# Patient Record
Sex: Female | Born: 1980 | Race: Black or African American | Hispanic: No | Marital: Single | Smoking: Never smoker
Health system: Southern US, Community
[De-identification: ages and names within clinical notes are randomized; demographics above are authoritative.]

## PROBLEM LIST (undated history)

## (undated) ENCOUNTER — Inpatient Hospital Stay (HOSPITAL_COMMUNITY): Payer: Self-pay

## (undated) DIAGNOSIS — D219 Benign neoplasm of connective and other soft tissue, unspecified: Secondary | ICD-10-CM

## (undated) DIAGNOSIS — F329 Major depressive disorder, single episode, unspecified: Secondary | ICD-10-CM

## (undated) DIAGNOSIS — F32A Depression, unspecified: Secondary | ICD-10-CM

## (undated) HISTORY — PX: NO PAST SURGERIES: SHX2092

---

## 1999-07-27 ENCOUNTER — Other Ambulatory Visit: Admission: RE | Admit: 1999-07-27 | Discharge: 1999-07-27 | Payer: Self-pay | Admitting: Obstetrics and Gynecology

## 2000-09-06 ENCOUNTER — Emergency Department (HOSPITAL_COMMUNITY): Admission: EM | Admit: 2000-09-06 | Discharge: 2000-09-06 | Payer: Self-pay | Admitting: Emergency Medicine

## 2000-09-06 ENCOUNTER — Encounter: Payer: Self-pay | Admitting: Emergency Medicine

## 2001-01-17 ENCOUNTER — Emergency Department (HOSPITAL_COMMUNITY): Admission: EM | Admit: 2001-01-17 | Discharge: 2001-01-17 | Payer: Self-pay | Admitting: Emergency Medicine

## 2001-08-15 ENCOUNTER — Encounter: Admission: RE | Admit: 2001-08-15 | Discharge: 2001-08-15 | Payer: Self-pay | Admitting: Obstetrics and Gynecology

## 2001-08-15 ENCOUNTER — Encounter: Payer: Self-pay | Admitting: Obstetrics and Gynecology

## 2001-11-22 ENCOUNTER — Other Ambulatory Visit: Admission: RE | Admit: 2001-11-22 | Discharge: 2001-11-22 | Payer: Self-pay | Admitting: Obstetrics and Gynecology

## 2001-12-10 ENCOUNTER — Encounter: Payer: Self-pay | Admitting: Obstetrics and Gynecology

## 2001-12-10 ENCOUNTER — Ambulatory Visit (HOSPITAL_COMMUNITY): Admission: RE | Admit: 2001-12-10 | Discharge: 2001-12-10 | Payer: Self-pay | Admitting: Obstetrics and Gynecology

## 2002-01-29 ENCOUNTER — Inpatient Hospital Stay (HOSPITAL_COMMUNITY): Admission: AD | Admit: 2002-01-29 | Discharge: 2002-01-29 | Payer: Self-pay | Admitting: Obstetrics and Gynecology

## 2002-01-30 ENCOUNTER — Inpatient Hospital Stay (HOSPITAL_COMMUNITY): Admission: AD | Admit: 2002-01-30 | Discharge: 2002-02-01 | Payer: Self-pay | Admitting: Obstetrics and Gynecology

## 2002-03-13 ENCOUNTER — Other Ambulatory Visit: Admission: RE | Admit: 2002-03-13 | Discharge: 2002-03-13 | Payer: Self-pay | Admitting: Obstetrics and Gynecology

## 2005-11-23 ENCOUNTER — Encounter: Payer: Self-pay | Admitting: Obstetrics and Gynecology

## 2005-11-23 ENCOUNTER — Ambulatory Visit: Payer: Self-pay | Admitting: Obstetrics and Gynecology

## 2005-11-28 ENCOUNTER — Ambulatory Visit: Payer: Self-pay | Admitting: *Deleted

## 2006-12-21 ENCOUNTER — Ambulatory Visit: Payer: Self-pay | Admitting: Obstetrics and Gynecology

## 2006-12-21 ENCOUNTER — Encounter: Payer: Self-pay | Admitting: Obstetrics and Gynecology

## 2007-09-19 ENCOUNTER — Emergency Department (HOSPITAL_COMMUNITY): Admission: EM | Admit: 2007-09-19 | Discharge: 2007-09-19 | Payer: Self-pay | Admitting: Family Medicine

## 2007-10-10 ENCOUNTER — Emergency Department (HOSPITAL_COMMUNITY): Admission: EM | Admit: 2007-10-10 | Discharge: 2007-10-10 | Payer: Self-pay | Admitting: Family Medicine

## 2008-06-18 ENCOUNTER — Encounter: Admission: RE | Admit: 2008-06-18 | Discharge: 2008-06-18 | Payer: Self-pay | Admitting: Obstetrics

## 2008-06-19 ENCOUNTER — Ambulatory Visit (HOSPITAL_COMMUNITY): Admission: RE | Admit: 2008-06-19 | Discharge: 2008-06-19 | Payer: Self-pay | Admitting: Obstetrics

## 2010-12-24 NOTE — Group Therapy Note (Signed)
NAME:  Heidi Ryan, Heidi Ryan               ACCOUNT NO.:  [id number]   MEDICAL RECORD NO.:  [id number]          PATIENT TYPE:  WOC   LOCATION:  [facility]                   FACILITY:  [facility]   PHYSICIAN:  Argentina Donovan, MD        DATE OF BIRTH:  07/02/1981   DATE OF SERVICE:  11/23/2005                                    CLINIC NOTE   The patient is a 30 year old gravida 1, para 1-0-0-1 with child age 40 years  old who has come in for her annual pelvic examination, Pap smear, and would  like to be started on oral contraceptives.  She works in a day care center,  so needs a PPD which will be given.   PHYSICAL EXAMINATION:  ABDOMEN:  Soft, flat, nontender.  No masses or  organomegaly.  PELVIC:  External genitalia is normal.  BUS within normal limits.  Vagina is  clean and well rugated.  Cervix is clean and parous.  Uterus anterior,  normal size, shape, consistency with normal adnexa.  Cul-de-sac was free.   Pap smear was taken.  PPD given and the patient was prescribed YAZ and given  two sample packets.  We also discussed the method for taking the birth  control.   IMPRESSION:  Normal GYN annual examination.           ______________________________  Argentina Donovan, MD     PR/MEDQ  D:  11/23/2005  T:  11/24/2005  Job:  [id number]

## 2010-12-24 NOTE — H&P (Signed)
[facility]  Patient:    Heidi Ryan, BROPHY Visit Number: [id number] MRN: [id number]          Service Type: OBS Location: [facility] Attending Physician:  Jaymes Graff A Dictated by:   Mack Guise, C.N.M. Admit Date:  01/29/2002 Discharge Date: 01/29/2002                           History and Physical  HISTORY OF PRESENT ILLNESS:   The patient is a 30 year old single African-American female gravida 3 para 0-0-2-0; [facility] February 08, 2002 by ultrasound on August 15, 2001.  She presents from the office of [facility] in active labor, being 6 cm dilated.  She reports positive fetal movement, no bleeding, no rupture of membranes.  Denies any headache, visual changes, or epigastric pain.  Her pregnancy has been followed by the M.D. service at [facility] and is remarkable for: 1. Questionable LMP, conceived on OCPs. 2. Late to care.  PRENATAL LABORATORY DATA:     On November 22, 2001:  Hemoglobin and hematocrit 10.2 and 31.4; platelets 214,000.  Blood type and Rh A positive, antibody screen negative.  Sickle cell trait negative.  VDRL nonreactive.  Rubella immune.  Hepatitis B surface antigen negative.  HIV nonreactive.  Pap smear within normal limits.  GC and chlamydia negative.  At 28 weeks, one-hour glucose challenge 79.  She is positive for group B strep.  HISTORY OF PREGNANCY:         This patient was initially evaluated at the office of [facility] on November 20, 2001 at 26 weeks 6 days gestation.  Her pregnancy has been followed for questionable IUGR, but follow-up ultrasound at 30 weeks found estimated fetal weight 50-75th percentile with normal fluid and growth. The patients cervix at that time was noted to be 1 cm dilated, 50% effaced, and she was placed on level 2 bedrest.  Fetal fibronectin at that time was negative, and the patients pregnancy has progressed normally from that time. She has been normotensive with no proteinuria.  ALLERGIES:                     PENICILLIN.  HABITS:                       The patient denies the use of tobacco, alcohol, or illicit drugs.  OBSTETRICAL HISTORY:          In 2002, induced AB; in 2002, SAB; and the present pregnancy.  MEDICAL HISTORY:              Unremarkable.  FAMILY HISTORY:               Maternal uncle - MI.  Paternal grandmother and maternal grandmother with a history of chronic hypertension.  Paternal grandfather - diabetes.  Maternal grandmother - leukemia.  Maternal uncle - stroke.  The patients brother was shot at age 51 and passed away.  The patients ex-boyfriend committed suicide at age 18.  GENETIC HISTORY:              Unremarkable.  There is no family history of familial or genetic disorders, children that died in infancy or that were born with birth defects.  SOCIAL HISTORY:               The patient is a single 30 year old African-American female.  The father of the baby, Heidi Ryan, is involved and supportive.  They  are [employer] in their faith.  REVIEW OF SYSTEMS:            There are no signs or symptoms suggestive of focal or systemic disease and the patient is typical of one with a uterine pregnancy at term in active labor.  PHYSICAL EXAMINATION:  VITAL SIGNS:                  Stable, afebrile.  HEENT:                        Unremarkable.  HEART:                        Regular rate and rhythm.  LUNGS:                        Clear.  ABDOMEN:                      Soft and nontender, gravida in its contour. Uterine fundus is noted to extend 38 cm above the level of the pubic symphysis.  Leopolds maneuvers find the infant to be in a longitudinal lie, cephalic presentation, and the estimated fetal weight 6.5-7 pounds.  PELVIC:                       Digital exam of the cervix finds it to be 6 cm dilated with bulging membranes, the vertex at a 0 station in the ROP position. Artificial rupture of membranes was performed by Dr. Dois Davenport Rivard for clear fluid.  EXTREMITIES:                   Show no pathologic edema.  DTRs are 1+ with no clonus.  ASSESSMENT:                   1. Intrauterine pregnancy at term.                               2. Active labor.  PLAN:                         1. Admit per Dr. Dois Davenport Rivard.                               2. Follow expectantly in anticipation of                                  spontaneous vaginal delivery. Dictated by:   Mack Guise, C.N.M. Attending Physician:  Michael Litter DD:  01/30/02 TD:  01/31/02 Job: [id number]

## 2011-05-02 LAB — INFLUENZA A AND B ANTIGEN (CONVERTED LAB)
Inflenza A Ag: NEGATIVE
Influenza B Ag: NEGATIVE

## 2011-11-27 ENCOUNTER — Encounter (HOSPITAL_COMMUNITY): Payer: Self-pay

## 2011-11-27 ENCOUNTER — Emergency Department (HOSPITAL_COMMUNITY)
Admission: EM | Admit: 2011-11-27 | Discharge: 2011-11-27 | Disposition: A | Discharge status: Home / Self Care | Payer: Self-pay | Attending: Emergency Medicine | Admitting: Emergency Medicine

## 2011-11-27 DIAGNOSIS — R350 Frequency of micturition: Secondary | ICD-10-CM | POA: Insufficient documentation

## 2011-11-27 DIAGNOSIS — N390 Urinary tract infection, site not specified: Secondary | ICD-10-CM | POA: Insufficient documentation

## 2011-11-27 LAB — URINE MICROSCOPIC-ADD ON

## 2011-11-27 LAB — URINALYSIS, ROUTINE W REFLEX MICROSCOPIC
Bilirubin Urine: NEGATIVE
Nitrite: NEGATIVE
Protein, ur: NEGATIVE mg/dL
Urobilinogen, UA: 2 mg/dL — ABNORMAL HIGH (ref 0.0–1.0)
pH: 7 (ref 5.0–8.0)

## 2011-11-27 MED ORDER — SULFAMETHOXAZOLE-TRIMETHOPRIM 800-160 MG PO TABS: 1.0000 | ORAL_TABLET | Freq: Two times a day (BID) | ORAL | Status: AC

## 2011-11-27 NOTE — ED Provider Notes (Signed)
History     CSN: [id number]  Arrival date & time 11/27/11  1114   First MD Initiated Contact with Patient 11/27/11 1138      Chief Complaint  Patient presents with  . Urinary Frequency    (Consider location/radiation/quality/duration/timing/severity/associated sxs/prior treatment) The history is provided by the patient.   patient is a generally healthy 31 year old female who presents to the emergency department with a chief complaint of 2 weeks of urinary frequency. Denies dysuria. Denies fever, chills, abdominal pain, hematuria, vaginal discharge/itching/irritation, polydipsia. LMP 2 weeks ago. Nothing makes her symptoms better or worse. No prior treatment.  History reviewed. No pertinent past medical history.  History reviewed. No pertinent past surgical history.  No family history on file.  History  Substance Use Topics  . Smoking status: Never Smoker   . Smokeless tobacco: Not on file  . Alcohol Use: No     Review of Systems  Constitutional: Negative for fever and chills.  HENT: Negative for sore throat and neck pain.   Eyes: Negative for pain and visual disturbance.  Respiratory: Negative for cough and shortness of breath.   Cardiovascular: Negative for chest pain.  Gastrointestinal: Negative for nausea, vomiting and abdominal pain.  Genitourinary:       See HPI, otherwise negative  Musculoskeletal: Negative for back pain and gait problem.  Skin: Negative for rash.  Neurological: Negative for dizziness, syncope, weakness and headaches.  Psychiatric/Behavioral: Negative for confusion.    Allergies  Review of patient's allergies indicates no known allergies.  Home Medications  No current outpatient prescriptions on file.  BP 102/56  Pulse 70  Temp(Src) 98.8 F (37.1 C) (Oral)  Resp 16  SpO2 100%  LMP 11/12/2011  Physical Exam  Constitutional:       Vital signs reviewed and are normal. Afebrile.   patient is well-developed, well-nourished, in no acute  distress. Head is normocephalic and atraumatic. [place] are moist. Neck is supple. Heart with regular rate and rhythm. No murmur. Lungs are clear to auscultation bilaterally. Normal respiratory effort. Abdomen is soft, nontender nondistended. Bowel sounds are normal. There is no rebound or guarding. Extremities without edema or tenderness. Patient is alert and oriented x3. Speech is clear. Normal gait. Skin is warm and dry without any obvious rash or lesion.  ED Course  Procedures (including critical care time)  Labs Reviewed  URINALYSIS, ROUTINE W REFLEX MICROSCOPIC - Abnormal; Notable for the following:    APPearance CLOUDY (*)    Urobilinogen, UA 2.0 (*)    Leukocytes, UA SMALL (*)    All other components within normal limits  URINE MICROSCOPIC-ADD ON - Abnormal; Notable for the following:    Squamous Epithelial / LPF FEW (*)    Bacteria, UA MANY (*)    All other components within normal limits  PREGNANCY, URINE   No results found.   Dx 1: UTI   MDM  Urinary frequency. Benign physical examination, doubt pyelonephritis. Negative pregnancy test. Many bacteria in urine, will tx for UTI. Advised PCP follow-up if no improvement.        Shaaron Adler, PA-C 11/27/11 1300

## 2011-11-27 NOTE — ED Provider Notes (Signed)
Medical screening examination/treatment/procedure(s) were performed by non-physician practitioner and as supervising physician I was immediately available for consultation/collaboration.   Forbes Cellar, MD 11/27/11 1301

## 2011-11-27 NOTE — ED Notes (Signed)
Pt states urinary frequency x2 weeks denies odor pain or burning

## 2011-11-27 NOTE — Discharge Instructions (Signed)

## 2011-11-27 NOTE — ED Notes (Signed)
Pt alert and oriented x4. Respirations even and unlabored, bilateral symmetrical rise and fall of chest. Skin warm and dry. In no acute distress. Denies needs.  PA at bedside 

## 2011-11-30 LAB — URINE CULTURE
Culture  Setup Time: 201304211803
Special Requests: NORMAL

## 2013-07-21 ENCOUNTER — Emergency Department (HOSPITAL_COMMUNITY)
Admission: EM | Admit: 2013-07-21 | Discharge: 2013-07-21 | Disposition: A | Discharge status: Home / Self Care | Payer: PRIVATE HEALTH INSURANCE | Attending: Emergency Medicine | Admitting: Emergency Medicine

## 2013-07-21 ENCOUNTER — Encounter (HOSPITAL_COMMUNITY): Payer: Self-pay | Admitting: Emergency Medicine

## 2013-07-21 DIAGNOSIS — IMO0002 Reserved for concepts with insufficient information to code with codable children: Secondary | ICD-10-CM | POA: Insufficient documentation

## 2013-07-21 DIAGNOSIS — Z79899 Other long term (current) drug therapy: Secondary | ICD-10-CM | POA: Insufficient documentation

## 2013-07-21 DIAGNOSIS — S4980XA Other specified injuries of shoulder and upper arm, unspecified arm, initial encounter: Secondary | ICD-10-CM | POA: Insufficient documentation

## 2013-07-21 DIAGNOSIS — S199XXA Unspecified injury of neck, initial encounter: Secondary | ICD-10-CM

## 2013-07-21 DIAGNOSIS — S0993XA Unspecified injury of face, initial encounter: Secondary | ICD-10-CM | POA: Insufficient documentation

## 2013-07-21 DIAGNOSIS — S46909A Unspecified injury of unspecified muscle, fascia and tendon at shoulder and upper arm level, unspecified arm, initial encounter: Secondary | ICD-10-CM | POA: Insufficient documentation

## 2013-07-21 MED ORDER — IBUPROFEN 800 MG PO TABS: 800.0000 mg | ORAL_TABLET | Freq: Three times a day (TID) | ORAL | Status: DC

## 2013-07-21 MED ORDER — METHOCARBAMOL 500 MG PO TABS: 500.0000 mg | ORAL_TABLET | Freq: Two times a day (BID) | ORAL | Status: DC
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
## 2013-07-21 NOTE — ED Provider Notes (Signed)
Medical screening examination/treatment/procedure(s) were performed by non-physician practitioner and as supervising physician I was immediately available for consultation/collaboration.  EKG Interpretation   None        Flint Melter, MD 07/21/13 [phone]
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
## 2013-07-24 ENCOUNTER — Emergency Department (HOSPITAL_COMMUNITY)
Admission: EM | Admit: 2013-07-24 | Discharge: 2013-07-24 | Disposition: A | Discharge status: Home / Self Care | Payer: PRIVATE HEALTH INSURANCE | Attending: Emergency Medicine | Admitting: Emergency Medicine

## 2013-07-24 ENCOUNTER — Encounter (HOSPITAL_COMMUNITY): Payer: Self-pay | Admitting: Emergency Medicine

## 2013-07-24 ENCOUNTER — Emergency Department (HOSPITAL_COMMUNITY): Payer: PRIVATE HEALTH INSURANCE

## 2013-07-24 DIAGNOSIS — S4980XA Other specified injuries of shoulder and upper arm, unspecified arm, initial encounter: Secondary | ICD-10-CM | POA: Insufficient documentation

## 2013-07-24 DIAGNOSIS — IMO0002 Reserved for concepts with insufficient information to code with codable children: Secondary | ICD-10-CM | POA: Insufficient documentation

## 2013-07-24 DIAGNOSIS — T148XXA Other injury of unspecified body region, initial encounter: Secondary | ICD-10-CM

## 2013-07-24 DIAGNOSIS — S46909A Unspecified injury of unspecified muscle, fascia and tendon at shoulder and upper arm level, unspecified arm, initial encounter: Secondary | ICD-10-CM | POA: Insufficient documentation

## 2013-07-24 DIAGNOSIS — Z79899 Other long term (current) drug therapy: Secondary | ICD-10-CM | POA: Insufficient documentation

## 2013-07-24 DIAGNOSIS — T7411XA Adult physical abuse, confirmed, initial encounter: Secondary | ICD-10-CM | POA: Insufficient documentation

## 2013-07-24 DIAGNOSIS — M62838 Other muscle spasm: Secondary | ICD-10-CM

## 2013-07-24 DIAGNOSIS — S0993XA Unspecified injury of face, initial encounter: Secondary | ICD-10-CM | POA: Insufficient documentation

## 2013-07-24 MED ORDER — KETOROLAC TROMETHAMINE 60 MG/2ML IM SOLN
60.0000 mg | Freq: Once | INTRAMUSCULAR | Status: AC
  Administered 2013-07-24: 60 mg via INTRAMUSCULAR
  Filled 2013-07-24: qty 2
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
## 2013-07-26 NOTE — ED Provider Notes (Signed)
Medical screening examination/treatment/procedure(s) were performed by non-physician practitioner and as supervising physician I was immediately available for consultation/collaboration.  EKG Interpretation   None       Devoria Albe, MD, Armando Gang   Ward Givens, MD 07/26/13 1501

## 2013-12-11 ENCOUNTER — Other Ambulatory Visit: Payer: Self-pay | Admitting: General Practice

## 2013-12-11 DIAGNOSIS — R1030 Lower abdominal pain, unspecified: Secondary | ICD-10-CM

## 2014-01-07 ENCOUNTER — Ambulatory Visit
Admission: RE | Admit: 2014-01-07 | Discharge: 2014-01-07 | Disposition: A | Discharge status: Home / Self Care | Payer: PRIVATE HEALTH INSURANCE | Source: Ambulatory Visit | Attending: General Practice | Admitting: General Practice

## 2014-01-07 ENCOUNTER — Encounter (INDEPENDENT_AMBULATORY_CARE_PROVIDER_SITE_OTHER): Payer: Self-pay

## 2014-01-07 DIAGNOSIS — R1030 Lower abdominal pain, unspecified: Secondary | ICD-10-CM

## 2014-01-17 ENCOUNTER — Ambulatory Visit
Admission: RE | Admit: 2014-01-17 | Discharge: 2014-01-17 | Disposition: A | Discharge status: Home / Self Care | Payer: PRIVATE HEALTH INSURANCE | Source: Ambulatory Visit | Attending: General Practice | Admitting: General Practice

## 2014-02-18 ENCOUNTER — Inpatient Hospital Stay (HOSPITAL_COMMUNITY): Payer: No Typology Code available for payment source

## 2014-02-18 ENCOUNTER — Inpatient Hospital Stay (HOSPITAL_COMMUNITY)
Admission: AD | Admit: 2014-02-18 | Discharge: 2014-02-18 | Disposition: A | Discharge status: Home / Self Care | Payer: No Typology Code available for payment source | Source: Ambulatory Visit | Attending: Obstetrics and Gynecology | Admitting: Obstetrics and Gynecology

## 2014-02-18 ENCOUNTER — Encounter (HOSPITAL_COMMUNITY): Payer: Self-pay

## 2014-02-18 DIAGNOSIS — R109 Unspecified abdominal pain: Secondary | ICD-10-CM | POA: Insufficient documentation

## 2014-02-18 DIAGNOSIS — O4590 Premature separation of placenta, unspecified, unspecified trimester: Secondary | ICD-10-CM

## 2014-02-18 DIAGNOSIS — O418X10 Other specified disorders of amniotic fluid and membranes, first trimester, not applicable or unspecified: Secondary | ICD-10-CM

## 2014-02-18 DIAGNOSIS — O208 Other hemorrhage in early pregnancy: Secondary | ICD-10-CM | POA: Insufficient documentation

## 2014-02-18 DIAGNOSIS — O468X1 Other antepartum hemorrhage, first trimester: Secondary | ICD-10-CM

## 2014-02-18 DIAGNOSIS — D259 Leiomyoma of uterus, unspecified: Secondary | ICD-10-CM

## 2014-02-18 HISTORY — DX: Benign neoplasm of connective and other soft tissue, unspecified: D21.9

## 2014-02-18 LAB — URINE MICROSCOPIC-ADD ON: WBC, UA: NONE SEEN WBC/hpf (ref <3)

## 2014-02-18 LAB — CBC WITH DIFFERENTIAL/PLATELET
Basophils Absolute: 0 10*3/uL (ref 0.0–0.1)
Basophils Relative: 0 % (ref 0–1)
EOS ABS: 0.1 10*3/uL (ref 0.0–0.7)
Eosinophils Relative: 1 % (ref 0–5)
HCT: 34.3 % — ABNORMAL LOW (ref 36.0–46.0)
HEMOGLOBIN: 11.6 g/dL — AB (ref 12.0–15.0)
Lymphocytes Relative: 26 % (ref 12–46)
Lymphs Abs: 3.3 10*3/uL (ref 0.7–4.0)
MCH: 30.4 pg (ref 26.0–34.0)
MCHC: 33.8 g/dL (ref 30.0–36.0)
MCV: 90 fL (ref 78.0–100.0)
MONOS PCT: 7 % (ref 3–12)
Monocytes Absolute: 0.9 10*3/uL (ref 0.1–1.0)
NEUTROS PCT: 66 % (ref 43–77)
Neutro Abs: 8.3 10*3/uL — ABNORMAL HIGH (ref 1.7–7.7)
PLATELETS: 251 10*3/uL (ref 150–400)
RBC: 3.81 MIL/uL — AB (ref 3.87–5.11)
RDW: 13.1 % (ref 11.5–15.5)
WBC: 12.6 10*3/uL — AB (ref 4.0–10.5)

## 2014-02-18 LAB — URINALYSIS, ROUTINE W REFLEX MICROSCOPIC
BILIRUBIN URINE: NEGATIVE
GLUCOSE, UA: NEGATIVE mg/dL
Ketones, ur: NEGATIVE mg/dL
Leukocytes, UA: NEGATIVE
NITRITE: NEGATIVE
PROTEIN: NEGATIVE mg/dL
SPECIFIC GRAVITY, URINE: 1.01 (ref 1.005–1.030)
UROBILINOGEN UA: 0.2 mg/dL (ref 0.0–1.0)
pH: 7 (ref 5.0–8.0)

## 2014-02-18 LAB — ABO/RH: ABO/RH(D): A POS

## 2014-02-18 LAB — WET PREP, GENITAL
Trich, Wet Prep: NONE SEEN
YEAST WET PREP: NONE SEEN

## 2014-02-18 LAB — POCT PREGNANCY, URINE: Preg Test, Ur: POSITIVE — AB

## 2014-02-18 LAB — HCG, QUANTITATIVE, PREGNANCY: HCG, BETA CHAIN, QUANT, S: 189344 m[IU]/mL — AB (ref <5)

## 2014-02-18 NOTE — Discharge Instructions (Signed)
Fibroids Fibroids are lumps (tumors) that can occur any place in a woman's body. These lumps are not cancerous. Fibroids vary in size, weight, and where they grow. HOME CARE  Do not take aspirin.  Write down the number of pads or tampons you use during your period. Tell your doctor. This can help determine the best treatment for you. GET HELP RIGHT AWAY IF:  You have pain in your lower belly (abdomen) that is not helped with medicine.  You have cramps that are not helped with medicine.  You have more bleeding between or during your period.  You feel lightheaded or pass out (faint).  Your lower belly pain gets worse. MAKE SURE YOU:  Understand these instructions.  Will watch your condition.  Will get help right away if you are not doing well or get worse. Document Released: 08/27/2010 Document Revised: 10/17/2011 Document Reviewed: 08/27/2010 [facility] Patient Information 2015 [place], [place]. This information is not intended to replace advice given to you by your health care provider. Make sure you discuss any questions you have with your health care provider. Pregnancy, First Trimester The first trimester is the first 3 months your baby is growing inside you. It is important to follow your doctor's instructions. HOME CARE   Do not smoke.  Do not drink alcohol.  Only take medicine as told by your doctor.  Exercise.  Eat healthy foods. Eat regular, well-balanced meals.  You can have sex (intercourse) if there are no other problems with the pregnancy.  Things that help with morning sickness:  Eat soda crackers before getting up in the morning.  Eat 4 to 5 small meals rather than 3 large meals.  Drink liquids between meals, not during meals.  Go to all appointments as told.  Take all vitamins or supplements as told by your doctor. GET HELP RIGHT AWAY IF:   You develop a fever.  You have a bad smelling fluid that is leaking from your vagina.  There is bleeding  from the vagina.  You develop severe belly (abdominal) or back pain.  You throw up (vomit) blood. It may look like coffee grounds.  You lose more than 2 pounds in a week.  You gain 5 pounds or more in a week.  You gain more than 2 pounds in a week and you see puffiness (swelling) in your feet, ankles, or legs.  You have severe dizziness or pass out (faint).  You are around people who have [place] measles, chickenpox, or fifth disease.  You have a headache, watery poop (diarrhea), pain with peeing (urinating), or cannot breath right. Document Released: 01/11/2008 Document Revised: 10/17/2011 Document Reviewed: 06/04/2013 [facility] Patient Information 2015 [place], [place]. This information is not intended to replace advice given to you by your health care provider. Make sure you discuss any questions you have with your health care provider. Pelvic Rest Pelvic rest is sometimes recommended for women when:   The placenta is partially or completely covering the opening of the cervix (placenta previa).  There is bleeding between the uterine wall and the amniotic sac in the first trimester (subchorionic hemorrhage).  The cervix begins to open without labor starting (incompetent cervix, cervical insufficiency).  The labor is too early (preterm labor). HOME CARE INSTRUCTIONS  Do not have sexual intercourse, stimulation, or an orgasm.  Do not use tampons, douche, or put anything in the vagina.  Do not lift anything over 10 pounds (4.5 kg).  Avoid strenuous activity or straining your pelvic muscles. [place]  CARE IF:  You have any vaginal bleeding during pregnancy. Treat this as a potential emergency.  You have cramping pain felt low in the stomach (stronger than menstrual cramps).  You notice vaginal discharge (watery, mucus, or bloody).  You have a low, dull backache.  There are regular contractions or uterine tightening. SEEK IMMEDIATE MEDICAL CARE IF: You have vaginal  bleeding and have placenta previa.  Document Released: 11/19/2010 Document Revised: 10/17/2011 Document Reviewed: 11/19/2010 [facility] Patient Information 2015 [place], [place]. This information is not intended to replace advice given to you by your health care provider. Make sure you discuss any questions you have with your health care provider. Subchorionic Hematoma A subchorionic hematoma is a gathering of blood between the outer wall of the placenta and the inner wall of the womb (uterus). The placenta is the organ that connects the fetus to the wall of the uterus. The placenta performs the feeding, breathing (oxygen to the fetus), and waste removal (excretory work) of the fetus.  Subchorionic hematoma is the most common abnormality found on a result from ultrasonography done during the first trimester or early second trimester of pregnancy. If there has been little or no vaginal bleeding, early small hematomas usually shrink on their own and do not affect your baby or pregnancy. The blood is gradually absorbed over 1-2 weeks. When bleeding starts later in pregnancy or the hematoma is larger or occurs in an older pregnant woman, the outcome may not be as good. Larger hematomas may get bigger, which increases the chances for miscarriage. Subchorionic hematoma also increases the risk of premature detachment of the placenta from the uterus, preterm (premature) labor, and stillbirth. HOME CARE INSTRUCTIONS   Stay on bed rest if your health care provider recommends this. Although bed rest will not prevent more bleeding or prevent a miscarriage, your health care provider may recommend bed rest until you are advised otherwise.  Avoid heavy lifting (more than 10 lb [4.5 kg]), exercise, sexual intercourse, or douching as directed by your health care provider.  Keep track of the number of pads you use each day and how soaked (saturated) they are. Write down this information.  Do not use tampons.  Keep all  follow-up appointments as directed by your health care provider. Your health care provider may ask you to have follow-up blood tests or ultrasound tests or both. SEEK IMMEDIATE MEDICAL CARE IF:   You have severe cramps in your stomach, back, abdomen, or pelvis.  You have a fever.  You pass large clots or tissue. Save any tissue for your health care provider to look at.  Your bleeding increases or you become lightheaded, feel weak, or have fainting episodes. Document Released: 11/09/2006 Document Revised: 05/15/2013 Document Reviewed: 02/21/2013 [facility] Patient Information 2015 [place], [place]. This information is not intended to replace advice given to you by your health care provider. Make sure you discuss any questions you have with your health care provider.

## 2014-02-18 NOTE — MAU Provider Note (Signed)
History     CSN: [id number]  Arrival date and time: 02/18/14 7494   First Provider Initiated Contact with Patient 02/18/14 2102      Chief Complaint  Patient presents with  . Vaginal Bleeding  . Abdominal Cramping   HPI [name] is a 33 y.o. G3P1011 at [redacted]w[redacted]d who presents to MAU today with complaint of vaginal bleeding x 2 weeks. That patient states that she has had some episodes of heavier bleeding and sometimes just pink spotting. She states that she used 3 pads yesterday. She is also having moderate right sided suprapubic pain. She rates her pain at 6/10. She has not taken anything for pain today. She denies vaginal discharge, fever, diarrhea or constipation or UTI symptoms. She does endorse occasional N/V.   OB History   Grav Para Term Preterm Abortions TAB SAB Ect Mult Living   3 1 1  0 1 1 0 0  1      Past Medical History  Diagnosis Date  . Fibroid     Past Surgical History  Procedure Laterality Date  . No past surgeries      History reviewed. No pertinent family history.  History  Substance Use Topics  . Smoking status: Never Smoker   . Smokeless tobacco: Not on file  . Alcohol Use: No    Allergies:  Allergies  Allergen Reactions  . Tylenol [Acetaminophen] Hives and Other (See Comments)    Causes redness in throat.    Prescriptions prior to admission  Medication Sig Dispense Refill  . ibuprofen (ADVIL,MOTRIN) 200 MG tablet Take 400 mg by mouth every 6 (six) hours as needed for moderate pain.      . Multiple Vitamin (MULTIVITAMIN WITH MINERALS) TABS tablet Take 1 tablet by mouth every morning.        Review of Systems  Constitutional: Negative for fever and malaise/fatigue.  Gastrointestinal: Positive for nausea, vomiting and abdominal pain. Negative for diarrhea and constipation.  Genitourinary: Negative for dysuria, urgency and frequency.       Neg - vaginal discharge, bleeding   Physical Exam   Blood pressure 118/76, pulse 85,  temperature 98.8 F (37.1 C), temperature source Oral, resp. rate 16, height 5\' 7"  (1.702 m), weight 172 lb 9.6 oz (78.291 kg), last menstrual period 01/11/2014.  Physical Exam  Constitutional: She is oriented to person, place, and time. She appears well-developed and well-nourished. No distress.  HENT:  Head: Normocephalic.  Cardiovascular: Normal rate.   Respiratory: Effort normal.  GI: Soft. She exhibits no distension and no mass. There is tenderness (moderate tenderness to palpation of the RLQ). There is no rebound and no guarding.  Neurological: She is alert and oriented to person, place, and time.  Skin: Skin is warm and dry. No erythema.  Psychiatric: She has a normal mood and affect.   Results for orders placed during the hospital encounter of 02/18/14 (from the past 24 hour(s))  URINALYSIS, ROUTINE W REFLEX MICROSCOPIC     Status: Abnormal   Collection Time    02/18/14  8:24 PM      Result Value Ref Range   Color, Urine YELLOW  YELLOW   APPearance CLEAR  CLEAR   Specific Gravity, Urine 1.010  1.005 - 1.030   pH 7.0  5.0 - 8.0   Glucose, UA NEGATIVE  NEGATIVE mg/dL   Hgb urine dipstick TRACE (*) NEGATIVE   Bilirubin Urine NEGATIVE  NEGATIVE   Ketones, ur NEGATIVE  NEGATIVE mg/dL   Protein,  ur NEGATIVE  NEGATIVE mg/dL   Urobilinogen, UA 0.2  0.0 - 1.0 mg/dL   Nitrite NEGATIVE  NEGATIVE   Leukocytes, UA NEGATIVE  NEGATIVE  POCT PREGNANCY, URINE     Status: Abnormal   Collection Time    02/18/14  8:24 PM      Result Value Ref Range   Preg Test, Ur POSITIVE (*) NEGATIVE  URINE MICROSCOPIC-ADD ON     Status: Abnormal   Collection Time    02/18/14  8:24 PM      Result Value Ref Range   Squamous Epithelial / LPF RARE  RARE   WBC, UA    <3 WBC/hpf   Value: NO FORMED ELEMENTS SEEN ON URINE MICROSCOPIC EXAMINATION   RBC / HPF 0-2  <3 RBC/hpf   Bacteria, UA FEW (*) RARE  WET PREP, GENITAL     Status: Abnormal   Collection Time    02/18/14  9:20 PM      Result Value Ref  Range   Yeast Wet Prep HPF POC NONE SEEN  NONE SEEN   Trich, Wet Prep NONE SEEN  NONE SEEN   Clue Cells Wet Prep HPF POC FEW (*) NONE SEEN   WBC, Wet Prep HPF POC FEW (*) NONE SEEN  CBC WITH DIFFERENTIAL     Status: Abnormal   Collection Time    02/18/14  9:35 PM      Result Value Ref Range   WBC 12.6 (*) 4.0 - 10.5 K/uL   RBC 3.81 (*) 3.87 - 5.11 MIL/uL   Hemoglobin 11.6 (*) 12.0 - 15.0 g/dL   HCT 34.3 (*) 36.0 - 46.0 %   MCV 90.0  78.0 - 100.0 fL   MCH 30.4  26.0 - 34.0 pg   MCHC 33.8  30.0 - 36.0 g/dL   RDW 13.1  11.5 - 15.5 %   Platelets 251  150 - 400 K/uL   Neutrophils Relative % 66  43 - 77 %   Neutro Abs 8.3 (*) 1.7 - 7.7 K/uL   Lymphocytes Relative 26  12 - 46 %   Lymphs Abs 3.3  0.7 - 4.0 K/uL   Monocytes Relative 7  3 - 12 %   Monocytes Absolute 0.9  0.1 - 1.0 K/uL   Eosinophils Relative 1  0 - 5 %   Eosinophils Absolute 0.1  0.0 - 0.7 K/uL   Basophils Relative 0  0 - 1 %   Basophils Absolute 0.0  0.0 - 0.1 K/uL  ABO/RH     Status: None   Collection Time    02/18/14  9:35 PM      Result Value Ref Range   ABO/RH(D) A POS    HCG, QUANTITATIVE, PREGNANCY     Status: Abnormal   Collection Time    02/18/14  9:35 PM      Result Value Ref Range   hCG, Beta [employer] [place], [place] [id number] (*) <5 mIU/mL   US Ob Comp Less 14 Wks  02/18/2014   CLINICAL DATA:  Pain and vaginal bleeding  EXAM: OBSTETRIC <14 WK [place] AND TRANSVAGINAL OB US  TECHNIQUE: Both transabdominal and transvaginal ultrasound examinations were performed for complete evaluation of the gestation as well as the maternal uterus, adnexal regions, and pelvic cul-de-sac. Transvaginal technique was performed to assess early pregnancy.  COMPARISON:  Pelvic ultrasound January 17, 2014  FINDINGS: Intrauterine gestational sac: Visualized/normal in shape.  Yolk sac:  Visualized  Embryo:  Visualized  Cardiac Activity: Visualized  Heart  Rate:  152 bpm  CRL:   12  mm   7 w 3  d                  [place] EDC: October 04, 2014  Maternal  uterus/adnexae: A subcentimeter uterine leiomyoma is again noted measuring 8 by 5 mm. There is a so-called chorionic bump, probably representing a small hematoma immediately adjacent to the gestational sac. This structure measures 1.7 by 1.0 cm. Cervical os is closed. Maternal ovaries bilaterally appear unremarkable. A small corpus luteum is noted in the left ovary. There is no free pelvic fluid.  IMPRESSION: Single live intrauterine gestation with estimated gestational age of 7+ weeks. There is a small so-called chorionic bump, probably representing a small liquefying hematoma immediately adjacent to the gestational sac. There is a small intrauterine leiomyoma, documented recently on pelvic ultrasound examination.   Electronically Signed   By: Lowella Grip M.D.   On: 02/18/2014 22:17   US Ob Transvaginal  02/18/2014   CLINICAL DATA:  Pain and vaginal bleeding  EXAM: OBSTETRIC <14 WK [place] AND TRANSVAGINAL OB US  TECHNIQUE: Both transabdominal and transvaginal ultrasound examinations were performed for complete evaluation of the gestation as well as the maternal uterus, adnexal regions, and pelvic cul-de-sac. Transvaginal technique was performed to assess early pregnancy.  COMPARISON:  Pelvic ultrasound January 17, 2014  FINDINGS: Intrauterine gestational sac: Visualized/normal in shape.  Yolk sac:  Visualized  Embryo:  Visualized  Cardiac Activity: Visualized  Heart Rate:  152 bpm  CRL:   12  mm   7 w 3  d                  [place] EDC: October 04, 2014  Maternal uterus/adnexae: A subcentimeter uterine leiomyoma is again noted measuring 8 by 5 mm. There is a so-called chorionic bump, probably representing a small hematoma immediately adjacent to the gestational sac. This structure measures 1.7 by 1.0 cm. Cervical os is closed. Maternal ovaries bilaterally appear unremarkable. A small corpus luteum is noted in the left ovary. There is no free pelvic fluid.  IMPRESSION: Single live intrauterine gestation with estimated  gestational age of 7+ weeks. There is a small so-called chorionic bump, probably representing a small liquefying hematoma immediately adjacent to the gestational sac. There is a small intrauterine leiomyoma, documented recently on pelvic ultrasound examination.   Electronically Signed   By: Lowella Grip M.D.   On: 02/18/2014 22:17    MAU Course  Procedures None  MDM +UPT UA, wet prep, GC/Chlamydia, CBC, ABO/Rh, quant hCG and [place] today  Assessment and Plan  A: [facility]UP at [redacted]w[redacted]d with normal cardiac activity Small subchorionic hemorrhage  P: Discharge home Bleeding precautions discussed Pelvic rest advised Patient advised to start prenatal care with [facility] provider of choice. List of area providers given. Patient may go to [place].  Pregnancy confirmation letter given Patient may return to MAU as needed or if her condition were to change or worsen   Farris Has, PA-C  02/18/2014, 10:22 PM

## 2014-02-18 NOTE — MAU Note (Signed)
Vaginal bleeding since Saturday, pink bleeding; slowed down today to spotting still pink in color. Pelvic pain, cramping, since the beginning of June. LMP 6/6.

## 2014-02-19 LAB — HIV ANTIBODY (ROUTINE TESTING W REFLEX): HIV 1&2 Ab, 4th Generation: NONREACTIVE

## 2014-02-19 LAB — GC/CHLAMYDIA PROBE AMP
CT PROBE, AMP APTIMA: NEGATIVE
GC Probe RNA: NEGATIVE

## 2014-02-20 NOTE — MAU Provider Note (Signed)
Attestation of Attending Supervision of Advanced Practitioner (CNM/NP): Evaluation and management procedures were performed by the Advanced Practitioner under my supervision and collaboration.  I have reviewed the Advanced Practitioner's note and chart, and I agree with the management and plan.  Jordon Bourquin 02/20/2014 9:22 AM

## 2014-04-01 ENCOUNTER — Inpatient Hospital Stay (HOSPITAL_COMMUNITY)
Admission: AD | Admit: 2014-04-01 | Discharge: 2014-04-02 | Disposition: A | Discharge status: Home / Self Care | Payer: No Typology Code available for payment source | Source: Ambulatory Visit | Attending: Obstetrics | Admitting: Obstetrics

## 2014-04-01 ENCOUNTER — Encounter (HOSPITAL_COMMUNITY): Payer: Self-pay | Admitting: *Deleted

## 2014-04-01 DIAGNOSIS — N760 Acute vaginitis: Secondary | ICD-10-CM | POA: Insufficient documentation

## 2014-04-01 DIAGNOSIS — B9689 Other specified bacterial agents as the cause of diseases classified elsewhere: Secondary | ICD-10-CM | POA: Diagnosis not present

## 2014-04-01 DIAGNOSIS — A499 Bacterial infection, unspecified: Secondary | ICD-10-CM | POA: Insufficient documentation

## 2014-04-01 DIAGNOSIS — N949 Unspecified condition associated with female genital organs and menstrual cycle: Secondary | ICD-10-CM

## 2014-04-01 DIAGNOSIS — R109 Unspecified abdominal pain: Secondary | ICD-10-CM | POA: Diagnosis present

## 2014-04-01 LAB — POCT PREGNANCY, URINE: PREG TEST UR: NEGATIVE

## 2014-04-01 NOTE — MAU Note (Signed)
C/o abdominal pain since 0300 this AM; denies vaginal bleeding; c/o yellowish discharge for past 2 days;

## 2014-04-01 NOTE — MAU Note (Signed)
PT SAYS SHE HAS PAIN - STARTED THIS AM-   ON LOWER ABD AND  ON SIDES .  HAS HAD BACK PAIN ALL PREG.      NO MEDS FOR PAIN. SEEN IN OFFICE LAST  -8-6  .   PT WENT TO [facility] ON 3-16- FOR  IRREG CYCLES   - TOLD HAD FIBROIDS    .   NEXT APPOINTMENT  WITH DR MARSHALL 9- SOMETHING.     LAST SEX- 7-31.     ALL GOOD WITH PREG.          NO PROBLEMS VOIDING.

## 2014-04-02 DIAGNOSIS — N760 Acute vaginitis: Secondary | ICD-10-CM | POA: Diagnosis not present

## 2014-04-02 LAB — WET PREP, GENITAL
Trich, Wet Prep: NONE SEEN
YEAST WET PREP: NONE SEEN

## 2014-04-02 LAB — URINALYSIS, ROUTINE W REFLEX MICROSCOPIC
BILIRUBIN URINE: NEGATIVE
GLUCOSE, UA: NEGATIVE mg/dL
Hgb urine dipstick: NEGATIVE
Ketones, ur: NEGATIVE mg/dL
Nitrite: NEGATIVE
PH: 5.5 (ref 5.0–8.0)
Protein, ur: NEGATIVE mg/dL
SPECIFIC GRAVITY, URINE: 1.025 (ref 1.005–1.030)
UROBILINOGEN UA: 1 mg/dL (ref 0.0–1.0)

## 2014-04-02 LAB — URINE MICROSCOPIC-ADD ON

## 2014-04-02 LAB — GC/CHLAMYDIA PROBE AMP
CT PROBE, AMP APTIMA: NEGATIVE
GC Probe RNA: NEGATIVE

## 2014-04-02 MED ORDER — METRONIDAZOLE 500 MG PO TABS: 500.0000 mg | ORAL_TABLET | Freq: Two times a day (BID) | ORAL | Status: DC

## 2014-04-02 NOTE — Discharge Instructions (Signed)
Round Ligament Pain During Pregnancy Round ligament pain is a sharp pain or jabbing feeling often felt in the lower belly or groin area on one or both sides. It is one of the most common complaints during pregnancy and is considered a normal part of pregnancy. It is most often felt during the second trimester.  Here is what you need to know about round ligament pain, including some tips to help you feel better.  Causes of Round Ligament Pain  Several thick ligaments surround and support your womb (uterus) as it grows during pregnancy. One of them is called the round ligament.  The round ligament connects the front part of the womb to your groin, the area where your legs attach to your pelvis. The round ligament normally tightens and relaxes slowly.  As your baby and womb grow, the round ligament stretches. That makes it more likely to become strained.  Sudden movements can cause the ligament to tighten quickly, like a rubber band snapping. This causes a sudden and quick jabbing feeling.  Symptoms of Round Ligament Pain  Round ligament pain can be concerning and uncomfortable. But it is considered normal as your body changes during pregnancy.  The symptoms of round ligament pain include a sharp, sudden spasm in the belly. It usually affects the right side, but it may happen on both sides. The pain only lasts a few seconds.  Exercise may cause the pain, as will rapid movements such as:  sneezing coughing laughing rolling over in bed standing up too quickly  Treatment of Round Ligament Pain  Here are some tips that may help reduce your discomfort:  Pain relief. Take over-the-counter acetaminophen for pain, if necessary. Ask your doctor if this is OK.  Exercise. Get plenty of exercise to keep your stomach (core) muscles strong. Doing stretching exercises or prenatal yoga can be helpful. Ask your doctor which exercises are safe for you and your baby.  A helpful exercise involves  putting your hands and knees on the floor, lowering your head, and pushing your backside into the air.  Avoid sudden movements. Change positions slowly (such as standing up or sitting down) to avoid sudden movements that may cause stretching and pain.  Flex your hips. Bend and flex your hips before you cough, sneeze, or laugh to avoid pulling on the ligaments.  Apply warmth. A heating pad or warm bath may be helpful. Ask your doctor if this is OK. Extreme heat can be dangerous to the baby.  You should try to modify your daily activity level and avoid positions that may worsen the condition.  When to Call the Doctor/Midwife  Always tell your doctor or midwife about any type of pain you have during pregnancy. Round ligament pain is quick and doesn't last long.  Call your health care provider immediately if you have:  severe pain fever chills pain on urination difficulty walking  Belly pain during pregnancy can be due to many different causes. It is important for your doctor to rule out more serious conditions, including pregnancy complications such as placenta abruption or non-pregnancy illnesses such as:  inguinal hernia appendicitis stomach, liver, and kidney problems Preterm labor pains may sometimes be mistaken for round ligament pain. Bacterial Vaginosis Bacterial vaginosis is a vaginal infection that occurs when the normal balance of bacteria in the vagina is disrupted. It results from an overgrowth of certain bacteria. This is the most common vaginal infection in women of childbearing age. Treatment is important to prevent complications, especially  in pregnant women, as it can cause a premature delivery. CAUSES  Bacterial vaginosis is caused by an increase in harmful bacteria that are normally present in smaller amounts in the vagina. Several different kinds of bacteria can cause bacterial vaginosis. However, the reason that the condition develops is not fully understood. RISK  FACTORS Certain activities or behaviors can put you at an increased risk of developing bacterial vaginosis, including:  Having a new sex partner or multiple sex partners.  Douching.  Using an intrauterine device (IUD) for contraception. Women do not get bacterial vaginosis from toilet seats, bedding, swimming pools, or contact with objects around them. SIGNS AND SYMPTOMS  Some women with bacterial vaginosis have no signs or symptoms. Common symptoms include:  Grey vaginal discharge.  A fishlike odor with discharge, especially after sexual intercourse.  Itching or burning of the vagina and vulva.  Burning or pain with urination. DIAGNOSIS  Your health care provider will take a medical history and examine the vagina for signs of bacterial vaginosis. A sample of vaginal fluid may be taken. Your health care provider will look at this sample under a microscope to check for bacteria and abnormal cells. A vaginal pH test may also be done.  TREATMENT  Bacterial vaginosis may be treated with antibiotic medicines. These may be given in the form of a pill or a vaginal cream. A second round of antibiotics may be prescribed if the condition comes back after treatment.  HOME CARE INSTRUCTIONS   Only take over-the-counter or prescription medicines as directed by your health care provider.  If antibiotic medicine was prescribed, take it as directed. Make sure you finish it even if you start to feel better.  Do not have sex until treatment is completed.  Tell all sexual partners that you have a vaginal infection. They should see their health care provider and be treated if they have problems, such as a mild rash or itching.  Practice safe sex by using condoms and only having one sex partner. SEEK MEDICAL CARE IF:   Your symptoms are not improving after 3 days of treatment.  You have increased discharge or pain.  You have a fever. MAKE SURE YOU:   Understand these instructions.  Will watch  your condition.  Will get help right away if you are not doing well or get worse. FOR MORE INFORMATION  Centers for Disease Control and Prevention, Division of STD Prevention: [employer]raud[employer] American Sexual Health Association (ASHA): www.ashastd.org  Document Released: 07/25/2005 Document Revised: 05/15/2013 Document Reviewed: 03/06/2013 [facility] Patient Information 2015 [place], [place]. This information is not intended to replace advice given to you by your health care provider. Make sure you discuss any questions you have with your health care provider. Second Trimester of Pregnancy The second trimester is from week 13 through week 28, months 4 through 6. The second trimester is often a time when you feel your best. Your body has also adjusted to being pregnant, and you begin to feel better physically. Usually, morning sickness has lessened or quit completely, you may have more energy, and you may have an increase in appetite. The second trimester is also a time when the fetus is growing rapidly. At the end of the sixth month, the fetus is about 9 inches long and weighs about 1 pounds. You will likely begin to feel the baby move (quickening) between 18 and 20 weeks of the pregnancy. BODY CHANGES Your body goes through many changes during pregnancy. The changes vary from woman to  woman.   Your weight will continue to increase. You will notice your lower abdomen bulging out.  You may begin to get stretch marks on your hips, abdomen, and breasts.  You may develop headaches that can be relieved by medicines approved by your health care provider.  You may urinate more often because the fetus is pressing on your bladder.  You may develop or continue to have heartburn as a result of your pregnancy.  You may develop constipation because certain hormones are causing the muscles that push waste through your intestines to slow down.  You may develop hemorrhoids or swollen, bulging veins (varicose  veins).  You may have back pain because of the weight gain and pregnancy hormones relaxing your joints between the bones in your pelvis and as a result of a shift in weight and the muscles that support your balance.  Your breasts will continue to grow and be tender.  Your gums may bleed and may be sensitive to brushing and flossing.  Dark spots or blotches (chloasma, mask of pregnancy) may develop on your face. This will likely fade after the baby is born.  A dark line from your belly button to the pubic area (linea nigra) may appear. This will likely fade after the baby is born.  You may have changes in your hair. These can include thickening of your hair, rapid growth, and changes in texture. Some women also have hair loss during or after pregnancy, or hair that feels dry or thin. Your hair will most likely return to normal after your baby is born. WHAT TO EXPECT AT YOUR PRENATAL VISITS During a routine prenatal visit:  You will be weighed to make sure you and the fetus are growing normally.  Your blood pressure will be taken.  Your abdomen will be measured to track your baby's growth.  The fetal heartbeat will be listened to.  Any test results from the previous visit will be discussed. Your health care provider may ask you:  How you are feeling.  If you are feeling the baby move.  If you have had any abnormal symptoms, such as leaking fluid, bleeding, severe headaches, or abdominal cramping.  If you have any questions. Other tests that may be performed during your second trimester include:  Blood tests that check for:  Low iron levels (anemia).  Gestational diabetes (between 24 and 28 weeks).  Rh antibodies.  Urine tests to check for infections, diabetes, or protein in the urine.  An ultrasound to confirm the proper growth and development of the baby.  An amniocentesis to check for possible genetic problems.  Fetal screens for spina bifida and Down syndrome. HOME  CARE INSTRUCTIONS   Avoid all smoking, herbs, alcohol, and unprescribed drugs. These chemicals affect the formation and growth of the baby.  Follow your health care provider's instructions regarding medicine use. There are medicines that are either safe or unsafe to take during pregnancy.  Exercise only as directed by your health care provider. Experiencing uterine cramps is a good sign to stop exercising.  Continue to eat regular, healthy meals.  Wear a good support bra for breast tenderness.  Do not use hot tubs, steam rooms, or saunas.  Wear your seat belt at all times when driving.  Avoid raw meat, uncooked cheese, cat litter boxes, and soil used by cats. These carry germs that can cause birth defects in the baby.  Take your prenatal vitamins.  Try taking a stool softener (if your health care provider  approves) if you develop constipation. Eat more high-fiber foods, such as fresh vegetables or fruit and whole grains. Drink plenty of fluids to keep your urine clear or pale yellow.  Take warm sitz baths to soothe any pain or discomfort caused by hemorrhoids. Use hemorrhoid cream if your health care provider approves.  If you develop varicose veins, wear support hose. Elevate your feet for 15 minutes, 3-4 times a day. Limit salt in your diet.  Avoid heavy lifting, wear low heel shoes, and practice good posture.  Rest with your legs elevated if you have leg cramps or low back pain.  Visit your dentist if you have not gone yet during your pregnancy. Use a soft toothbrush to brush your teeth and be gentle when you floss.  A sexual relationship may be continued unless your health care provider directs you otherwise.  Continue to go to all your prenatal visits as directed by your health care provider. SEEK MEDICAL CARE IF:   You have dizziness.  You have mild pelvic cramps, pelvic pressure, or nagging pain in the abdominal area.  You have persistent nausea, vomiting, or  diarrhea.  You have a bad smelling vaginal discharge.  You have pain with urination. SEEK IMMEDIATE MEDICAL CARE IF:   You have a fever.  You are leaking fluid from your vagina.  You have spotting or bleeding from your vagina.  You have severe abdominal cramping or pain.  You have rapid weight gain or loss.  You have shortness of breath with chest pain.  You notice sudden or extreme swelling of your face, hands, ankles, feet, or legs.  You have not felt your baby move in over an hour.  You have severe headaches that do not go away with medicine.  You have vision changes. Document Released: 07/19/2001 Document Revised: 07/30/2013 Document Reviewed: 09/25/2012 [facility] Patient Information 2015 [place], [place]. This information is not intended to replace advice given to you by your health care provider. Make sure you discuss any questions you have with your health care provider.

## 2014-04-02 NOTE — MAU Provider Note (Signed)
History     CSN: [id number]  Arrival date and time: 04/01/14 2143   First Provider Initiated Contact with Patient 04/01/14 2354      Chief Complaint  Patient presents with  . Abdominal Pain   Abdominal Pain    Heidi Ryan is a 33 y.o. G3P1011 at [redacted]w[redacted]d who presents today with bilateral lower abdominal pain. She points to the area of the round ligaments and states that she feels a pulling and stretching pain there. She denies any VB. She does report a yellow vaginal discharge with odor.   Past Medical History  Diagnosis Date  . Fibroid     Past Surgical History  Procedure Laterality Date  . No past surgeries      Family History  Problem Relation Age of Onset  . Alcohol abuse Neg Hx   . Arthritis Neg Hx   . Asthma Neg Hx   . Birth defects Neg Hx   . Cancer Neg Hx   . COPD Neg Hx   . Depression Neg Hx   . Diabetes Neg Hx   . Drug abuse Neg Hx   . Early death Neg Hx   . Hearing loss Neg Hx   . Heart disease Neg Hx   . Hyperlipidemia Neg Hx   . Hypertension Neg Hx   . Kidney disease Neg Hx   . Learning disabilities Neg Hx   . Mental illness Neg Hx   . Mental retardation Neg Hx   . Miscarriages / Stillbirths Neg Hx   . Stroke Neg Hx   . Vision loss Neg Hx   . Varicose Veins Neg Hx     History  Substance Use Topics  . Smoking status: Never Smoker   . Smokeless tobacco: Not on file  . Alcohol Use: No    Allergies:  Allergies  Allergen Reactions  . Tylenol [Acetaminophen] Hives and Other (See Comments)    Causes redness in throat.    Prescriptions prior to admission  Medication Sig Dispense Refill  . Multiple Vitamin (MULTIVITAMIN WITH MINERALS) TABS tablet Take 1 tablet by mouth every morning.        Review of Systems  Gastrointestinal: Positive for abdominal pain.   Physical Exam   Blood pressure 107/44, pulse 86, temperature 99 F (37.2 C), temperature source Oral, resp. rate 18, height 5\' 5"  (1.651 m), weight 75.978 kg (167 lb 8 oz), last  menstrual period 01/11/2014.  Physical Exam  Nursing note and vitals reviewed. Constitutional: She is oriented to person, place, and time. She appears well-developed and well-nourished. No distress.  Cardiovascular: Normal rate.   Respiratory: Effort normal.  GI: Soft. There is no tenderness. There is no rebound.  Genitourinary:   External: no lesion Vagina: small amount of yellow discharge Cervix: pink, smooth, no CMT, closed/thick. Some contact bleeding  Uterus: AGA, FHT 160s with doppler    Neurological: She is alert and oriented to person, place, and time.  Skin: Skin is warm and dry.  Psychiatric: She has a normal mood and affect.    MAU Course  Procedures  Results for orders placed during the hospital encounter of 04/01/14 (from the past 24 hour(s))  URINALYSIS, ROUTINE W REFLEX MICROSCOPIC     Status: Abnormal   Collection Time    04/01/14 10:40 PM      Result Value Ref Range   Color, Urine YELLOW  YELLOW   APPearance CLEAR  CLEAR   Specific Gravity, Urine 1.025  1.005 - 1.030  pH 5.5  5.0 - 8.0   Glucose, UA NEGATIVE  NEGATIVE mg/dL   Hgb urine dipstick NEGATIVE  NEGATIVE   Bilirubin Urine NEGATIVE  NEGATIVE   Ketones, ur NEGATIVE  NEGATIVE mg/dL   Protein, ur NEGATIVE  NEGATIVE mg/dL   Urobilinogen, UA 1.0  0.0 - 1.0 mg/dL   Nitrite NEGATIVE  NEGATIVE   Leukocytes, UA TRACE (*) NEGATIVE  URINE MICROSCOPIC-ADD ON     Status: Abnormal   Collection Time    04/01/14 10:40 PM      Result Value Ref Range   Squamous Epithelial / LPF RARE  RARE   WBC, UA 0-2  <3 WBC/hpf   RBC / HPF 0-2  <3 RBC/hpf   Bacteria, UA FEW (*) RARE  POCT PREGNANCY, URINE     Status: None   Collection Time    04/01/14 10:41 PM      Result Value Ref Range   Preg Test, Ur NEGATIVE  NEGATIVE  WET PREP, GENITAL     Status: Abnormal   Collection Time    04/02/14 12:05 AM      Result Value Ref Range   Yeast Wet Prep HPF POC NONE SEEN  NONE SEEN   Trich, Wet Prep NONE SEEN  NONE SEEN    Clue Cells Wet Prep HPF POC FEW (*) NONE SEEN   WBC, Wet Prep HPF POC MODERATE (*) NONE SEEN     Assessment and Plan   1. Round ligament pain   2. BV (bacterial vaginosis)   Second trimester precautions reviewed Return to MAU as needed    Medication List         metroNIDAZOLE 500 MG tablet  Commonly known as:  FLAGYL  Take 1 tablet (500 mg total) by mouth 2 (two) times daily.     multivitamin with minerals Tabs tablet  Take 1 tablet by mouth every morning.       Follow-up Information   Follow up with Frederico Hamman, MD.   Specialty:  Obstetrics and Gynecology   Contact information:   [place] [id number] [phone]        Mathis Bud 04/02/2014, 12:08 AM

## 2014-06-09 ENCOUNTER — Encounter (HOSPITAL_COMMUNITY): Payer: Self-pay | Admitting: *Deleted

## 2014-07-12 ENCOUNTER — Inpatient Hospital Stay (HOSPITAL_COMMUNITY): Payer: No Typology Code available for payment source

## 2014-07-12 ENCOUNTER — Encounter (HOSPITAL_COMMUNITY): Payer: Self-pay | Admitting: *Deleted

## 2014-07-12 ENCOUNTER — Inpatient Hospital Stay (HOSPITAL_COMMUNITY)
Admission: AD | Admit: 2014-07-12 | Discharge: 2014-07-14 | DRG: 780 | Disposition: A | Discharge status: Home / Self Care | Payer: No Typology Code available for payment source | Source: Ambulatory Visit | Attending: Obstetrics & Gynecology | Admitting: Obstetrics & Gynecology

## 2014-07-12 DIAGNOSIS — R109 Unspecified abdominal pain: Secondary | ICD-10-CM

## 2014-07-12 DIAGNOSIS — Z3A28 28 weeks gestation of pregnancy: Secondary | ICD-10-CM | POA: Diagnosis present

## 2014-07-12 DIAGNOSIS — O26873 Cervical shortening, third trimester: Secondary | ICD-10-CM | POA: Diagnosis present

## 2014-07-12 DIAGNOSIS — Z8249 Family history of ischemic heart disease and other diseases of the circulatory system: Secondary | ICD-10-CM

## 2014-07-12 DIAGNOSIS — Z825 Family history of asthma and other chronic lower respiratory diseases: Secondary | ICD-10-CM | POA: Diagnosis not present

## 2014-07-12 DIAGNOSIS — Z809 Family history of malignant neoplasm, unspecified: Secondary | ICD-10-CM

## 2014-07-12 DIAGNOSIS — O26899 Other specified pregnancy related conditions, unspecified trimester: Secondary | ICD-10-CM

## 2014-07-12 DIAGNOSIS — O2342 Unspecified infection of urinary tract in pregnancy, second trimester: Secondary | ICD-10-CM

## 2014-07-12 DIAGNOSIS — O4703 False labor before 37 completed weeks of gestation, third trimester: Principal | ICD-10-CM | POA: Diagnosis present

## 2014-07-12 DIAGNOSIS — N939 Abnormal uterine and vaginal bleeding, unspecified: Secondary | ICD-10-CM | POA: Diagnosis present

## 2014-07-12 DIAGNOSIS — O2343 Unspecified infection of urinary tract in pregnancy, third trimester: Secondary | ICD-10-CM | POA: Diagnosis present

## 2014-07-12 DIAGNOSIS — O2340 Unspecified infection of urinary tract in pregnancy, unspecified trimester: Secondary | ICD-10-CM | POA: Diagnosis present

## 2014-07-12 DIAGNOSIS — F329 Major depressive disorder, single episode, unspecified: Secondary | ICD-10-CM | POA: Diagnosis present

## 2014-07-12 DIAGNOSIS — O4702 False labor before 37 completed weeks of gestation, second trimester: Secondary | ICD-10-CM | POA: Diagnosis not present

## 2014-07-12 DIAGNOSIS — O4692 Antepartum hemorrhage, unspecified, second trimester: Secondary | ICD-10-CM

## 2014-07-12 DIAGNOSIS — Z8261 Family history of arthritis: Secondary | ICD-10-CM

## 2014-07-12 DIAGNOSIS — O26872 Cervical shortening, second trimester: Secondary | ICD-10-CM

## 2014-07-12 DIAGNOSIS — O4700 False labor before 37 completed weeks of gestation, unspecified trimester: Secondary | ICD-10-CM | POA: Diagnosis present

## 2014-07-12 HISTORY — DX: Depression, unspecified: F32.A

## 2014-07-12 HISTORY — DX: Major depressive disorder, single episode, unspecified: F32.9

## 2014-07-12 LAB — WET PREP, GENITAL
CLUE CELLS WET PREP: NONE SEEN
Trich, Wet Prep: NONE SEEN
Yeast Wet Prep HPF POC: NONE SEEN

## 2014-07-12 LAB — CBC WITH DIFFERENTIAL/PLATELET
Basophils Absolute: 0 10*3/uL (ref 0.0–0.1)
Basophils Relative: 0 % (ref 0–1)
EOS ABS: 0.1 10*3/uL (ref 0.0–0.7)
Eosinophils Relative: 1 % (ref 0–5)
HCT: 32.6 % — ABNORMAL LOW (ref 36.0–46.0)
Hemoglobin: 11 g/dL — ABNORMAL LOW (ref 12.0–15.0)
Lymphocytes Relative: 14 % (ref 12–46)
Lymphs Abs: 2.1 10*3/uL (ref 0.7–4.0)
MCH: 31.3 pg (ref 26.0–34.0)
MCHC: 33.7 g/dL (ref 30.0–36.0)
MCV: 92.6 fL (ref 78.0–100.0)
Monocytes Absolute: 0.6 10*3/uL (ref 0.1–1.0)
Monocytes Relative: 4 % (ref 3–12)
Neutro Abs: 12.5 10*3/uL — ABNORMAL HIGH (ref 1.7–7.7)
Neutrophils Relative %: 81 % — ABNORMAL HIGH (ref 43–77)
Platelets: 221 10*3/uL (ref 150–400)
RBC: 3.52 MIL/uL — ABNORMAL LOW (ref 3.87–5.11)
RDW: 13.4 % (ref 11.5–15.5)
WBC: 15.4 10*3/uL — AB (ref 4.0–10.5)

## 2014-07-12 LAB — RAPID URINE DRUG SCREEN, HOSP PERFORMED
Amphetamines: NOT DETECTED
Barbiturates: NOT DETECTED
Benzodiazepines: NOT DETECTED
COCAINE: NOT DETECTED
OPIATES: NOT DETECTED
Tetrahydrocannabinol: NOT DETECTED

## 2014-07-12 LAB — URINE MICROSCOPIC-ADD ON

## 2014-07-12 LAB — GROUP B STREP BY PCR: Group B strep by PCR: POSITIVE — AB

## 2014-07-12 LAB — URINALYSIS, ROUTINE W REFLEX MICROSCOPIC
Bilirubin Urine: NEGATIVE
GLUCOSE, UA: NEGATIVE mg/dL
KETONES UR: NEGATIVE mg/dL
Nitrite: NEGATIVE
Protein, ur: NEGATIVE mg/dL
Specific Gravity, Urine: 1.01 (ref 1.005–1.030)
UROBILINOGEN UA: 0.2 mg/dL (ref 0.0–1.0)
pH: 7 (ref 5.0–8.0)

## 2014-07-12 LAB — TYPE AND SCREEN
ABO/RH(D): A POS
Antibody Screen: NEGATIVE

## 2014-07-12 LAB — CBC
HCT: 31.4 % — ABNORMAL LOW (ref 36.0–46.0)
HEMOGLOBIN: 10.7 g/dL — AB (ref 12.0–15.0)
MCH: 31 pg (ref 26.0–34.0)
MCHC: 34.1 g/dL (ref 30.0–36.0)
MCV: 91 fL (ref 78.0–100.0)
Platelets: 225 10*3/uL (ref 150–400)
RBC: 3.45 MIL/uL — ABNORMAL LOW (ref 3.87–5.11)
RDW: 13.4 % (ref 11.5–15.5)
WBC: 13.1 10*3/uL — AB (ref 4.0–10.5)

## 2014-07-12 LAB — HIV ANTIBODY (ROUTINE TESTING W REFLEX): HIV: NONREACTIVE

## 2014-07-12 LAB — OB RESULTS CONSOLE GBS: GBS: POSITIVE

## 2014-07-12 MED ORDER — BETAMETHASONE SOD PHOS & ACET 6 (3-3) MG/ML IJ SUSP
12.0000 mg | INTRAMUSCULAR | Status: AC
  Administered 2014-07-12 – 2014-07-13 (×2): 12 mg via INTRAMUSCULAR
  Filled 2014-07-12 (×2): qty 2

## 2014-07-12 MED ORDER — CALCIUM CARBONATE ANTACID 500 MG PO CHEW: 2.0000 | CHEWABLE_TABLET | ORAL | Status: DC | PRN

## 2014-07-12 MED ORDER — AZITHROMYCIN 500 MG IV SOLR
500.0000 mg | INTRAVENOUS | Status: DC
  Administered 2014-07-12 – 2014-07-13 (×2): 500 mg via INTRAVENOUS
  Filled 2014-07-12 (×3): qty 500

## 2014-07-12 MED ORDER — LACTATED RINGERS IV SOLN
INTRAVENOUS | Status: DC
  Administered 2014-07-12: 100 mL/h via INTRAVENOUS
  Administered 2014-07-13: 04:00:00 via INTRAVENOUS

## 2014-07-12 MED ORDER — SULFAMETHOXAZOLE-TRIMETHOPRIM 800-160 MG PO TABS
1.0000 | ORAL_TABLET | Freq: Two times a day (BID) | ORAL | Status: DC
  Administered 2014-07-12 – 2014-07-14 (×4): 1 via ORAL
  Filled 2014-07-12 (×6): qty 1

## 2014-07-12 MED ORDER — MAGNESIUM SULFATE BOLUS VIA INFUSION
4.0000 g | Freq: Once | INTRAVENOUS | Status: AC
  Administered 2014-07-12: 4 g via INTRAVENOUS
  Filled 2014-07-12: qty 500

## 2014-07-12 MED ORDER — DOCUSATE SODIUM 100 MG PO CAPS
100.0000 mg | ORAL_CAPSULE | Freq: Every day | ORAL | Status: DC
  Administered 2014-07-13 – 2014-07-14 (×2): 100 mg via ORAL
  Filled 2014-07-12 (×3): qty 1

## 2014-07-12 MED ORDER — LACTATED RINGERS IV BOLUS (SEPSIS)
1000.0000 mL | Freq: Once | INTRAVENOUS | Status: AC
  Administered 2014-07-12: 1000 mL via INTRAVENOUS

## 2014-07-12 MED ORDER — MAGNESIUM SULFATE 40 G IN LACTATED RINGERS - SIMPLE
2.0000 g/h | INTRAVENOUS | Status: DC
  Filled 2014-07-12: qty 500

## 2014-07-12 MED ORDER — PRENATAL MULTIVITAMIN CH
1.0000 | ORAL_TABLET | Freq: Every day | ORAL | Status: DC
  Administered 2014-07-13: 1 via ORAL
  Filled 2014-07-12: qty 1

## 2014-07-12 MED ORDER — ZOLPIDEM TARTRATE 5 MG PO TABS: 5.0000 mg | ORAL_TABLET | Freq: Every evening | ORAL | Status: DC | PRN

## 2014-07-12 NOTE — MAU Note (Signed)
Patient presents with complaint of abdominal pain since Tuesday.

## 2014-07-12 NOTE — H&P (Signed)
Heidi Ryan is a 33 y.o. female presenting for contractions/vaginal bleeding. Maternal Medical History:  Reason for admission: Contractions and vaginal bleeding.  The blood was brown, admixed with mucous.  She denies recent coitus, abnormal vaginal discharge, trauma, leakage of fluid, smoking or drug use.  A previous delivery was at term.  Contractions: Onset was 1 week ago.   Frequency: irregular.   Perceived severity is moderate.    Fetal activity: Perceived fetal activity is normal.    Prenatal complications: no prenatal complications Prenatal Complications - Diabetes: none.    OB History    Gravida Para Term Preterm AB TAB SAB Ectopic Multiple Living   3 1 1  0 1  1 0  1     Past Medical History  Diagnosis Date  . Fibroid   . Depression    Past Surgical History  Procedure Laterality Date  . No past surgeries     Family History: family history includes Arthritis in her mother; Asthma in her daughter; Cancer in her maternal grandmother and mother; Hypertension in her father. There is no history of Alcohol abuse, Birth defects, COPD, Depression, Diabetes, Drug abuse, Early death, Hearing loss, Heart disease, Hyperlipidemia, Kidney disease, Learning disabilities, Mental illness, Mental retardation, Miscarriages / Stillbirths, Stroke, Vision loss, or Varicose Veins. Social History:  reports that she has never smoked. She has never used smokeless tobacco. She reports that she does not drink alcohol or use illicit drugs.     Review of Systems  Constitutional: Negative for fever.  Eyes: Negative for blurred vision.  Respiratory: Negative for shortness of breath.   Gastrointestinal: Negative for vomiting.  Skin: Negative for rash.  Neurological: Negative for headaches.    Dilation: 1 Exam by:: Heidi Ryan, CNM Blood pressure 113/68, pulse 97, temperature 98.5 F (36.9 C), temperature source Oral, resp. rate 16, height 5\' 5"  (1.651 m), weight 83.689 kg (184 lb 8 oz),  last menstrual period 01/11/2014, SpO2 100 %. Maternal Exam:  Uterine Assessment: Contraction frequency is irregular.   Abdomen: not evaluated.  Introitus: not evaluated.   Cervix: Cervix evaluated by digital exam.     Fetal Exam Fetal Monitor Review: Baseline rate: 160.  Variability: moderate (6-25 bpm).   Pattern: no accelerations and no decelerations.    Fetal State Assessment: Category I - tracings are normal.     Physical Exam  Constitutional: She appears well-developed.  HENT:  Head: Normocephalic.  Neck: Neck supple. No thyromegaly present.  Cardiovascular: Normal rate and regular rhythm.   Respiratory: Breath sounds normal.  GI: Soft. Bowel sounds are normal.  Skin: No rash noted.    Prenatal labs: ABO, Rh: --/--/A POS (07/14 2135) Antibody:   Rubella:   RPR:    HBsAg:    HIV: NONREACTIVE (07/14 2135)  GBS:     Assessment/Plan: IUP @ 108w0d.  Threatened PTL.  Vaginal bleeding likely secondary to preterm cervical changes.  UTI symptoms FHT reassuring  Admit MgSO4 for tocolysis/neuroprophylaxis Antibiotics for UTI Steroids   Heidi Ryan A 07/12/2014, 7:25 PM

## 2014-07-12 NOTE — MAU Provider Note (Signed)
Chief Complaint:  Abdominal Pain   First Provider Initiated Contact with Patient 07/12/14 1624    HPI: Heidi Ryan is a 33 y.o. G3P1011 at [redacted]w[redacted]d who presents to maternity admissions reporting low abd pain x 5 days and spotting today. Abd pain is intermittent, worse w/ ambulation. Unsure if she is contracting. Denies fever, chills, abd tenderness, leakage of fluid or vaginal bleeding. Good fetal movement.   Pt reports normal anatomy scan last month at Dr. Marcheta Grammes office. A pos.   Pregnancy Course: Uncomplicated.   Past Medical History: Past Medical History  Diagnosis Date  . Fibroid     Past obstetric history: OB History  Gravida Para Term Preterm AB SAB TAB Ectopic Multiple Living  3 1 1  0 1 1  0  1    # Outcome Date GA Lbr Len/2nd Weight Sex Delivery Anes PTL Lv  3 Current           2 Term 2003     Vag-Spont   Y  1 SAB               Past Surgical History: Past Surgical History  Procedure Laterality Date  . No past surgeries       Family History: Family History  Problem Relation Age of Onset  . Alcohol abuse Neg Hx   . Arthritis Neg Hx   . Asthma Neg Hx   . Birth defects Neg Hx   . Cancer Neg Hx   . COPD Neg Hx   . Depression Neg Hx   . Diabetes Neg Hx   . Drug abuse Neg Hx   . Early death Neg Hx   . Hearing loss Neg Hx   . Heart disease Neg Hx   . Hyperlipidemia Neg Hx   . Hypertension Neg Hx   . Kidney disease Neg Hx   . Learning disabilities Neg Hx   . Mental illness Neg Hx   . Mental retardation Neg Hx   . Miscarriages / Stillbirths Neg Hx   . Stroke Neg Hx   . Vision loss Neg Hx   . Varicose Veins Neg Hx     Social History: History  Substance Use Topics  . Smoking status: Never Smoker   . Smokeless tobacco: Never Used  . Alcohol Use: No    Allergies:  Allergies  Allergen Reactions  . Penicillins Anaphylaxis  . Tylenol [Acetaminophen] Hives and Other (See Comments)    Causes redness in throat.    Meds:  Prescriptions prior to  admission  Medication Sig Dispense Refill Last Dose  . IRON PO Take 1 tablet by mouth daily.   07/12/2014 at Unknown time  . Multiple Vitamin (MULTIVITAMIN WITH MINERALS) TABS tablet Take 1 tablet by mouth daily.    07/12/2014 at Unknown time  . metroNIDAZOLE (FLAGYL) 500 MG tablet Take 1 tablet (500 mg total) by mouth 2 (two) times daily. (Patient not taking: Reported on 07/12/2014) 14 tablet 0     ROS: Pertinent pos findings in history of present illness. Possible increased urinary frequency. Neg for fever, chills, dysuria, urgency, hematuria, flank pain, GI complaints, vaginal odor, trauma.   Physical Exam  Blood pressure 119/71, pulse 99, temperature 98.5 F (36.9 C), temperature source Oral, resp. rate 16, height 5\' 5"  (1.651 m), weight 83.689 kg (184 lb 8 oz), last menstrual period 01/11/2014. GENERAL: Well-developed, well-nourished female in no acute distress.  HEENT: normocephalic HEART: normal rate RESP: normal effort ABDOMEN: Soft, non-tender, gravid appropriate for gestational  age EXTREMITIES: Nontender, no edema NEURO: alert and oriented SPECULUM EXAM: NEFG, small amount of tan mucoid, odorless discharge, no frank blood, cervix clean   Dilation: 1 Cervical Position: Posterior Exam by:: Manya Silvas, CNM  FHT:  Baseline 150 , moderate variability, accelerations present, no decelerations Contractions: irreg, w/ UI   Labs: Results for orders placed or performed during the hospital encounter of 07/12/14 (from the past 24 hour(s))  Urinalysis, Routine w reflex microscopic     Status: Abnormal   Collection Time: 07/12/14  1:02 PM  Result Value Ref Range   Color, Urine YELLOW YELLOW   APPearance CLEAR CLEAR   Specific Gravity, Urine 1.010 1.005 - 1.030   pH 7.0 5.0 - 8.0   Glucose, UA NEGATIVE NEGATIVE mg/dL   Hgb urine dipstick LARGE (A) NEGATIVE   Bilirubin Urine NEGATIVE NEGATIVE   Ketones, ur NEGATIVE NEGATIVE mg/dL   Protein, ur NEGATIVE NEGATIVE mg/dL    Urobilinogen, UA 0.2 0.0 - 1.0 mg/dL   Nitrite NEGATIVE NEGATIVE   Leukocytes, UA SMALL (A) NEGATIVE  Urine microscopic-add on     Status: Abnormal   Collection Time: 07/12/14  1:02 PM  Result Value Ref Range   Squamous Epithelial / LPF FEW (A) RARE   WBC, UA 7-10 <3 WBC/hpf   RBC / HPF 3-6 <3 RBC/hpf   Bacteria, UA FEW (A) RARE    Imaging:  Cervical length 1.7 cm. No previa. No evidence of abruption.   MAU Course:   Assessment: 1. Short cervical length during pregnancy in second trimester   2. Abdominal pain affecting pregnancy, antepartum   3. Vaginal bleeding in pregnancy, second trimester   4. UTI in pregnancy, antepartum, second trimester   5. Short cervix in second trimester, antepartum    Plan: Admit to Antenatal for BMZ, Mag Sulfate per consult w/ Dr. Delsa Sale. Azithromycin for possible cervicitis. Bactrim for possible UTI Urine culture, GC/Chlam pending. Dr. Lisbeth Renshaw consulted. Agrees w/ POC. Vaginal progesterone not indicated.   Heidi Ryan, Heidi Ryan 07/12/2014 2:08 PM

## 2014-07-13 ENCOUNTER — Inpatient Hospital Stay (HOSPITAL_COMMUNITY): Payer: No Typology Code available for payment source

## 2014-07-13 LAB — RPR

## 2014-07-13 MED ORDER — TRAMADOL HCL 50 MG PO TABS
50.0000 mg | ORAL_TABLET | Freq: Once | ORAL | Status: AC
  Administered 2014-07-13: 50 mg via ORAL
  Filled 2014-07-13: qty 1

## 2014-07-13 MED ORDER — MAGNESIUM SULFATE 40 G IN LACTATED RINGERS - SIMPLE
2.0000 g/h | INTRAVENOUS | Status: AC
  Filled 2014-07-13: qty 500
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
## 2014-07-13 NOTE — Progress Notes (Signed)
Patient ID: Heidi Ryan, female   DOB: 24-Jan-1981, 33 y.o.   MRN: [id number] Hospital Day: 2  S: Preterm labor symptoms: none; denies vaginal bleeding  O: Blood pressure 107/63, pulse 87, temperature 98.2 F (36.8 C), temperature source Oral, resp. rate 18, height 5\' 5"  (1.651 m), weight 83.689 kg (184 lb 8 oz), last menstrual period 01/11/2014, SpO2 100 %.   OVA:NVBTYOMA: 160 bpm, Variability: Good {> 6 bpm) and Decelerations: Absent Toco: None YOK:HTXHFSFS: 1 Cervical Position: Posterior Exam by:: Manya Silvas, CNM  A/P- 33 y.o. admitted with:  Present on Admission:  . UTI in pregnancy . Threatened preterm labor  Pregnancy Complications: none  Preterm labor management: currently on MgSO4 for tocolysis Dating: [redacted]w[redacted]d PNL Needed:  ?TDAP FWB:  FHT c/w fetal well-being PTL:  Adequate tocolysis at present ROD: spontaneous vaginal

## 2014-07-14 DIAGNOSIS — O4702 False labor before 37 completed weeks of gestation, second trimester: Secondary | ICD-10-CM

## 2014-07-14 LAB — GC/CHLAMYDIA PROBE AMP
CT PROBE, AMP APTIMA: NEGATIVE
GC PROBE AMP APTIMA: NEGATIVE

## 2014-07-14 MED ORDER — TRAMADOL HCL 50 MG PO TABS
50.0000 mg | ORAL_TABLET | Freq: Once | ORAL | Status: AC
  Administered 2014-07-14: 50 mg via ORAL
  Filled 2014-07-14: qty 1

## 2014-07-14 NOTE — Progress Notes (Signed)
Heidi Ryan  was seen today for an ultrasound appointment.  See scanned report in Media tab of EPIC.  Impression: Single IUP at 28w 1d Normal fetal anatomic survey.  Somewhat limted views of the posterior fossa obtained due to fetal position. Fetal growth is appropriate (1176 g; 43rd %tile) Posterior placenta without previa Normal amniotic fluid volume  Recommendations: Follow up ultrasounds as clinically indicated.  Benjaman Lobe, MD

## 2014-07-14 NOTE — Discharge Instructions (Signed)
Discharge instructions   You can wash your hair  Shower  Eat what you want  Drink what you want  See me in 6 weeks  Your ankles are going to swell more in the next 2 weeks than when pregnant  No sex for 6 weeks   MARSHALL,BERNARD A, MD 07/14/2014   Abdominal Pain During Pregnancy Belly (abdominal) pain is common during pregnancy. Most of the time, it is not a serious problem. Other times, it can be a sign that something is wrong with the pregnancy. Always tell your doctor if you have belly pain. HOME CARE Monitor your belly pain for any changes. The following actions may help you feel better:  Do not have sex (intercourse) or put anything in your vagina until you feel better.  Rest until your pain stops.  Drink clear fluids if you feel sick to your stomach (nauseous). Do not eat solid food until you feel better.  Only take medicine as told by your doctor.  Keep all doctor visits as told. GET HELP RIGHT AWAY IF:   You are bleeding, leaking fluid, or pieces of tissue come out of your vagina.  You have more pain or cramping.  You keep throwing up (vomiting).  You have pain when you pee (urinate) or have blood in your pee.  You have a fever.  You do not feel your baby moving as much.  You feel very weak or feel like passing out.  You have trouble breathing, with or without belly pain.  You have a very bad headache and belly pain.  You have fluid leaking from your vagina and belly pain.  You keep having watery poop (diarrhea).  Your belly pain does not go away after resting, or the pain gets worse. MAKE SURE YOU:   Understand these instructions.  Will watch your condition.  Will get help right away if you are not doing well or get worse. Document Released: 07/13/2009 Document Revised: 03/27/2013 Document Reviewed: 02/21/2013 [facility] Patient Information 2015 [place], [place]. This information is not intended to replace advice given to you by your health  care provider. Make sure you discuss any questions you have with your health care provider.  Abdominal Pain During Pregnancy Belly (abdominal) pain is common during pregnancy. Most of the time, it is not a serious problem. Other times, it can be a sign that something is wrong with the pregnancy. Always tell your doctor if you have belly pain. HOME CARE Monitor your belly pain for any changes. The following actions may help you feel better:  Do not have sex (intercourse) or put anything in your vagina until you feel better.  Rest until your pain stops.  Drink clear fluids if you feel sick to your stomach (nauseous). Do not eat solid food until you feel better.  Only take medicine as told by your doctor.  Keep all doctor visits as told. GET HELP RIGHT AWAY IF:   You are bleeding, leaking fluid, or pieces of tissue come out of your vagina.  You have more pain or cramping.  You keep throwing up (vomiting).  You have pain when you pee (urinate) or have blood in your pee.  You have a fever.  You do not feel your baby moving as much.  You feel very weak or feel like passing out.  You have trouble breathing, with or without belly pain.  You have a very bad headache and belly pain.  You have fluid leaking from your vagina and belly  pain.  You keep having watery poop (diarrhea).  Your belly pain does not go away after resting, or the pain gets worse. MAKE SURE YOU:   Understand these instructions.  Will watch your condition.  Will get help right away if you are not doing well or get worse. Document Released: 07/13/2009 Document Revised: 03/27/2013 Document Reviewed: 02/21/2013 [facility] Patient Information 2015 [place], [place]. This information is not intended to replace advice given to you by your health care provider. Make sure you discuss any questions you have with your health care provider.  Abdominal Pain During Pregnancy Belly (abdominal) pain is common during  pregnancy. Most of the time, it is not a serious problem. Other times, it can be a sign that something is wrong with the pregnancy. Always tell your doctor if you have belly pain. HOME CARE Monitor your belly pain for any changes. The following actions may help you feel better:  Do not have sex (intercourse) or put anything in your vagina until you feel better.  Rest until your pain stops.  Drink clear fluids if you feel sick to your stomach (nauseous). Do not eat solid food until you feel better.  Only take medicine as told by your doctor.  Keep all doctor visits as told. GET HELP RIGHT AWAY IF:   You are bleeding, leaking fluid, or pieces of tissue come out of your vagina.  You have more pain or cramping.  You keep throwing up (vomiting).  You have pain when you pee (urinate) or have blood in your pee.  You have a fever.  You do not feel your baby moving as much.  You feel very weak or feel like passing out.  You have trouble breathing, with or without belly pain.  You have a very bad headache and belly pain.  You have fluid leaking from your vagina and belly pain.  You keep having watery poop (diarrhea).  Your belly pain does not go away after resting, or the pain gets worse. MAKE SURE YOU:   Understand these instructions.  Will watch your condition.  Will get help right away if you are not doing well or get worse. Document Released: 07/13/2009 Document Revised: 03/27/2013 Document Reviewed: 02/21/2013 [facility] Patient Information 2015 [place], [place]. This information is not intended to replace advice given to you by your health care provider. Make sure you discuss any questions you have with your health care provider.  Abdominal Pain During Pregnancy Belly (abdominal) pain is common during pregnancy. Most of the time, it is not a serious problem. Other times, it can be a sign that something is wrong with the pregnancy. Always tell your doctor if you have belly  pain. HOME CARE Monitor your belly pain for any changes. The following actions may help you feel better:  Do not have sex (intercourse) or put anything in your vagina until you feel better.  Rest until your pain stops.  Drink clear fluids if you feel sick to your stomach (nauseous). Do not eat solid food until you feel better.  Only take medicine as told by your doctor.  Keep all doctor visits as told. GET HELP RIGHT AWAY IF:   You are bleeding, leaking fluid, or pieces of tissue come out of your vagina.  You have more pain or cramping.  You keep throwing up (vomiting).  You have pain when you pee (urinate) or have blood in your pee.  You have a fever.  You do not feel your baby moving as much.  You feel very weak or feel like passing out.  You have trouble breathing, with or without belly pain.  You have a very bad headache and belly pain.  You have fluid leaking from your vagina and belly pain.  You keep having watery poop (diarrhea).  Your belly pain does not go away after resting, or the pain gets worse. MAKE SURE YOU:   Understand these instructions.  Will watch your condition.  Will get help right away if you are not doing well or get worse. Document Released: 07/13/2009 Document Revised: 03/27/2013 Document Reviewed: 02/21/2013 [facility] Patient Information 2015 [place], [place]. This information is not intended to replace advice given to you by your health care provider. Make sure you discuss any questions you have with your health care provider.  Abdominal Pain During Pregnancy Belly (abdominal) pain is common during pregnancy. Most of the time, it is not a serious problem. Other times, it can be a sign that something is wrong with the pregnancy. Always tell your doctor if you have belly pain. HOME CARE Monitor your belly pain for any changes. The following actions may help you feel better:  Do not have sex (intercourse) or put anything in your vagina until  you feel better.  Rest until your pain stops.  Drink clear fluids if you feel sick to your stomach (nauseous). Do not eat solid food until you feel better.  Only take medicine as told by your doctor.  Keep all doctor visits as told. GET HELP RIGHT AWAY IF:   You are bleeding, leaking fluid, or pieces of tissue come out of your vagina.  You have more pain or cramping.  You keep throwing up (vomiting).  You have pain when you pee (urinate) or have blood in your pee.  You have a fever.  You do not feel your baby moving as much.  You feel very weak or feel like passing out.  You have trouble breathing, with or without belly pain.  You have a very bad headache and belly pain.  You have fluid leaking from your vagina and belly pain.  You keep having watery poop (diarrhea).  Your belly pain does not go away after resting, or the pain gets worse. MAKE SURE YOU:   Understand these instructions.  Will watch your condition.  Will get help right away if you are not doing well or get worse. Document Released: 07/13/2009 Document Revised: 03/27/2013 Document Reviewed: 02/21/2013 [facility] Patient Information 2015 [place], [place]. This information is not intended to replace advice given to you by your health care provider. Make sure you discuss any questions you have with your health care provider.  Abdominal Pain During Pregnancy Belly (abdominal) pain is common during pregnancy. Most of the time, it is not a serious problem. Other times, it can be a sign that something is wrong with the pregnancy. Always tell your doctor if you have belly pain. HOME CARE Monitor your belly pain for any changes. The following actions may help you feel better:  Do not have sex (intercourse) or put anything in your vagina until you feel better.  Rest until your pain stops.  Drink clear fluids if you feel sick to your stomach (nauseous). Do not eat solid food until you feel better.  Only take  medicine as told by your doctor.  Keep all doctor visits as told. GET HELP RIGHT AWAY IF:   You are bleeding, leaking fluid, or pieces of tissue come out of your vagina.  You have  more pain or cramping.  You keep throwing up (vomiting).  You have pain when you pee (urinate) or have blood in your pee.  You have a fever.  You do not feel your baby moving as much.  You feel very weak or feel like passing out.  You have trouble breathing, with or without belly pain.  You have a very bad headache and belly pain.  You have fluid leaking from your vagina and belly pain.  You keep having watery poop (diarrhea).  Your belly pain does not go away after resting, or the pain gets worse. MAKE SURE YOU:   Understand these instructions.  Will watch your condition.  Will get help right away if you are not doing well or get worse. Document Released: 07/13/2009 Document Revised: 03/27/2013 Document Reviewed: 02/21/2013 [facility] Patient Information 2015 [place], [place]. This information is not intended to replace advice given to you by your health care provider. Make sure you discuss any questions you have with your health care provider.

## 2014-07-14 NOTE — Progress Notes (Signed)
Ur chart review completed.  

## 2014-07-14 NOTE — Progress Notes (Signed)
Patient seen today  for follow up ultrasound.  See scanned report in Media tab of EPIC.  TVUS - cervical 1.7 cm without funneling or dynamic changes.  Benjaman Lobe, MD

## 2014-07-14 NOTE — Discharge Summary (Signed)
Patient is 28 weeks and 2 days pregnant she came in because of irregular contractions and she received magnesium sulfate she also received betamethasone 2 she's not contracting now an ultrasound showed her cervix was  short 1.7 the patient will be discharged today on bed rest no sex no lifting at home to see me in 2 weeks to reevaluate her cervix

## 2014-07-16 LAB — CULTURE, OB URINE
Colony Count: 50000
SPECIAL REQUESTS: NORMAL

## 2014-07-23 ENCOUNTER — Other Ambulatory Visit (HOSPITAL_COMMUNITY): Payer: Self-pay | Admitting: Obstetrics

## 2014-07-23 DIAGNOSIS — O26873 Cervical shortening, third trimester: Secondary | ICD-10-CM

## 2014-07-23 DIAGNOSIS — Z3A30 30 weeks gestation of pregnancy: Secondary | ICD-10-CM

## 2014-07-28 ENCOUNTER — Encounter (HOSPITAL_COMMUNITY): Payer: Self-pay

## 2014-07-28 ENCOUNTER — Other Ambulatory Visit (HOSPITAL_COMMUNITY): Payer: Self-pay | Admitting: Obstetrics

## 2014-07-28 ENCOUNTER — Ambulatory Visit (HOSPITAL_COMMUNITY)
Admission: RE | Admit: 2014-07-28 | Discharge: 2014-07-28 | Disposition: A | Discharge status: Home / Self Care | Payer: No Typology Code available for payment source | Source: Ambulatory Visit | Attending: Obstetrics | Admitting: Obstetrics

## 2014-07-28 DIAGNOSIS — O26873 Cervical shortening, third trimester: Secondary | ICD-10-CM

## 2014-07-28 DIAGNOSIS — Z36 Encounter for antenatal screening of mother: Secondary | ICD-10-CM | POA: Diagnosis not present

## 2014-07-28 DIAGNOSIS — Z3A30 30 weeks gestation of pregnancy: Secondary | ICD-10-CM

## 2014-07-29 DIAGNOSIS — IMO0002 Reserved for concepts with insufficient information to code with codable children: Secondary | ICD-10-CM | POA: Insufficient documentation

## 2014-07-29 DIAGNOSIS — Z0489 Encounter for examination and observation for other specified reasons: Secondary | ICD-10-CM | POA: Insufficient documentation

## 2014-07-29 DIAGNOSIS — Z3A30 30 weeks gestation of pregnancy: Secondary | ICD-10-CM | POA: Insufficient documentation

## 2014-07-29 DIAGNOSIS — O26879 Cervical shortening, unspecified trimester: Secondary | ICD-10-CM | POA: Insufficient documentation

## 2014-08-06 LAB — OB RESULTS CONSOLE GC/CHLAMYDIA
Chlamydia: NEGATIVE
GC PROBE AMP, GENITAL: NEGATIVE

## 2014-08-06 LAB — OB RESULTS CONSOLE HEPATITIS B SURFACE ANTIGEN: Hepatitis B Surface Ag: NEGATIVE

## 2014-08-06 LAB — OB RESULTS CONSOLE RUBELLA ANTIBODY, IGM: RUBELLA: IMMUNE

## 2014-08-08 NOTE — L&D Delivery Note (Signed)
Delivery Note At 5:04 AM a viable female was delivered via Vaginal, Spontaneous Delivery (Presentation: Left Occiput Anterior).  APGAR: 9, 9; weight  .   Placenta status: Intact, Spontaneous.  Cord:  with the following complications: .  Cord pH: not done  Anesthesia: Epidural  Episiotomy: None Lacerations: None Suture Repair: 2.0 Est. Blood Loss (mL): 250  Mom to postpartum.  Baby to Couplet care / Skin to Skin.  Gabrille Kilbride A 10/01/2014, 5:10 AM

## 2014-09-30 ENCOUNTER — Encounter (HOSPITAL_COMMUNITY): Payer: Self-pay | Admitting: *Deleted

## 2014-09-30 ENCOUNTER — Inpatient Hospital Stay (HOSPITAL_COMMUNITY)
Admission: AD | Admit: 2014-09-30 | Discharge: 2014-10-03 | DRG: 775 | Disposition: A | Discharge status: Home / Self Care | Payer: Medicaid Other | Source: Ambulatory Visit | Attending: Obstetrics | Admitting: Obstetrics

## 2014-09-30 DIAGNOSIS — Z3A39 39 weeks gestation of pregnancy: Secondary | ICD-10-CM | POA: Diagnosis present

## 2014-09-30 DIAGNOSIS — Z3483 Encounter for supervision of other normal pregnancy, third trimester: Secondary | ICD-10-CM | POA: Diagnosis present

## 2014-09-30 DIAGNOSIS — O99824 Streptococcus B carrier state complicating childbirth: Secondary | ICD-10-CM | POA: Diagnosis present

## 2014-09-30 DIAGNOSIS — IMO0001 Reserved for inherently not codable concepts without codable children: Secondary | ICD-10-CM

## 2014-09-30 MED ORDER — ONDANSETRON HCL 4 MG/2ML IJ SOLN: 4.0000 mg | Freq: Four times a day (QID) | INTRAMUSCULAR | Status: DC | PRN

## 2014-09-30 MED ORDER — CLINDAMYCIN PHOSPHATE 900 MG/50ML IV SOLN
900.0000 mg | Freq: Three times a day (TID) | INTRAVENOUS | Status: DC
  Administered 2014-10-01: 900 mg via INTRAVENOUS
  Filled 2014-09-30 (×3): qty 50

## 2014-09-30 MED ORDER — CITRIC ACID-SODIUM CITRATE 334-500 MG/5ML PO SOLN: 30.0000 mL | ORAL | Status: DC | PRN

## 2014-09-30 MED ORDER — ACETAMINOPHEN 325 MG PO TABS
650.0000 mg | ORAL_TABLET | ORAL | Status: DC | PRN
Start: 2014-09-30 — End: 2014-10-01

## 2014-09-30 MED ORDER — OXYCODONE-ACETAMINOPHEN 5-325 MG PO TABS: 2.0000 | ORAL_TABLET | ORAL | Status: DC | PRN

## 2014-09-30 MED ORDER — LIDOCAINE HCL (PF) 1 % IJ SOLN
30.0000 mL | INTRAMUSCULAR | Status: DC | PRN
  Filled 2014-09-30: qty 30

## 2014-09-30 MED ORDER — LACTATED RINGERS IV SOLN: 500.0000 mL | INTRAVENOUS | Status: DC | PRN

## 2014-09-30 MED ORDER — OXYCODONE-ACETAMINOPHEN 5-325 MG PO TABS: 1.0000 | ORAL_TABLET | ORAL | Status: DC | PRN

## 2014-09-30 MED ORDER — OXYTOCIN BOLUS FROM INFUSION
500.0000 mL | INTRAVENOUS | Status: DC
  Administered 2014-10-01: 500 mL via INTRAVENOUS

## 2014-09-30 MED ORDER — BUTORPHANOL TARTRATE 1 MG/ML IJ SOLN
1.0000 mg | INTRAMUSCULAR | Status: DC | PRN
  Administered 2014-10-01: 1 mg via INTRAVENOUS
  Filled 2014-09-30: qty 1

## 2014-09-30 MED ORDER — TERBUTALINE SULFATE 1 MG/ML IJ SOLN: 0.2500 mg | Freq: Once | INTRAMUSCULAR | Status: AC | PRN

## 2014-09-30 MED ORDER — FLEET ENEMA 7-19 GM/118ML RE ENEM: 1.0000 | ENEMA | RECTAL | Status: DC | PRN

## 2014-09-30 MED ORDER — LACTATED RINGERS IV SOLN
INTRAVENOUS | Status: DC
  Administered 2014-09-30: via INTRAVENOUS

## 2014-09-30 MED ORDER — OXYTOCIN 40 UNITS IN LACTATED RINGERS INFUSION - SIMPLE MED
1.0000 m[IU]/min | INTRAVENOUS | Status: DC
  Administered 2014-10-01: 2 m[IU]/min via INTRAVENOUS
  Filled 2014-09-30: qty 1000

## 2014-09-30 MED ORDER — OXYTOCIN 40 UNITS IN LACTATED RINGERS INFUSION - SIMPLE MED: 62.5000 mL/h | INTRAVENOUS | Status: DC

## 2014-09-30 NOTE — MAU Note (Signed)
PT AWOKE  AT 930PM-  WENT TO B-ROOM- SROM   - 940PM.     HAS HAD   SOME UC  TODAY-   CALLED  DR   MARSHALL-   TOLD  TO COME  HERE.    VE LAST WEEK- 3 CM.    DENIES HSV AND MRSA. GBS- POST.

## 2014-10-01 ENCOUNTER — Inpatient Hospital Stay (HOSPITAL_COMMUNITY): Payer: Medicaid Other | Admitting: Anesthesiology

## 2014-10-01 ENCOUNTER — Encounter (HOSPITAL_COMMUNITY): Payer: Self-pay | Admitting: *Deleted

## 2014-10-01 LAB — CBC
HCT: 32.4 % — ABNORMAL LOW (ref 36.0–46.0)
HEMOGLOBIN: 10.9 g/dL — AB (ref 12.0–15.0)
MCH: 30.7 pg (ref 26.0–34.0)
MCHC: 33.6 g/dL (ref 30.0–36.0)
MCV: 91.3 fL (ref 78.0–100.0)
Platelets: 201 10*3/uL (ref 150–400)
RBC: 3.55 MIL/uL — ABNORMAL LOW (ref 3.87–5.11)
RDW: 13.4 % (ref 11.5–15.5)
WBC: 12.7 10*3/uL — AB (ref 4.0–10.5)

## 2014-10-01 LAB — TYPE AND SCREEN
ABO/RH(D): A POS
Antibody Screen: NEGATIVE

## 2014-10-01 LAB — RPR: RPR Ser Ql: NONREACTIVE

## 2014-10-01 MED ORDER — TETANUS-DIPHTH-ACELL PERTUSSIS 5-2.5-18.5 LF-MCG/0.5 IM SUSP: 0.5000 mL | Freq: Once | INTRAMUSCULAR | Status: DC

## 2014-10-01 MED ORDER — FERROUS SULFATE 325 (65 FE) MG PO TABS
325.0000 mg | ORAL_TABLET | Freq: Two times a day (BID) | ORAL | Status: DC
  Administered 2014-10-01 – 2014-10-03 (×3): 325 mg via ORAL
  Filled 2014-10-01 (×4): qty 1

## 2014-10-01 MED ORDER — DIPHENHYDRAMINE HCL 25 MG PO CAPS: 25.0000 mg | ORAL_CAPSULE | Freq: Four times a day (QID) | ORAL | Status: DC | PRN

## 2014-10-01 MED ORDER — OXYCODONE-ACETAMINOPHEN 5-325 MG PO TABS: 2.0000 | ORAL_TABLET | ORAL | Status: DC | PRN

## 2014-10-01 MED ORDER — FENTANYL 2.5 MCG/ML BUPIVACAINE 1/10 % EPIDURAL INFUSION (WH - ANES)
INTRAMUSCULAR | Status: DC | PRN
  Administered 2014-10-01: 14 mL/h via EPIDURAL

## 2014-10-01 MED ORDER — DIBUCAINE 1 % RE OINT: 1.0000 "application " | TOPICAL_OINTMENT | RECTAL | Status: DC | PRN

## 2014-10-01 MED ORDER — OXYCODONE-ACETAMINOPHEN 5-325 MG PO TABS: 1.0000 | ORAL_TABLET | ORAL | Status: DC | PRN

## 2014-10-01 MED ORDER — PRENATAL MULTIVITAMIN CH
1.0000 | ORAL_TABLET | Freq: Every day | ORAL | Status: DC
  Administered 2014-10-01: 1 via ORAL
  Filled 2014-10-01 (×2): qty 1

## 2014-10-01 MED ORDER — BENZOCAINE-MENTHOL 20-0.5 % EX AERO: 1.0000 "application " | INHALATION_SPRAY | CUTANEOUS | Status: DC | PRN

## 2014-10-01 MED ORDER — ZOLPIDEM TARTRATE 5 MG PO TABS: 5.0000 mg | ORAL_TABLET | Freq: Every evening | ORAL | Status: DC | PRN

## 2014-10-01 MED ORDER — WITCH HAZEL-GLYCERIN EX PADS: 1.0000 "application " | MEDICATED_PAD | CUTANEOUS | Status: DC | PRN

## 2014-10-01 MED ORDER — ONDANSETRON HCL 4 MG PO TABS: 4.0000 mg | ORAL_TABLET | ORAL | Status: DC | PRN

## 2014-10-01 MED ORDER — PHENYLEPHRINE 40 MCG/ML (10ML) SYRINGE FOR IV PUSH (FOR BLOOD PRESSURE SUPPORT)
80.0000 ug | PREFILLED_SYRINGE | INTRAVENOUS | Status: DC | PRN
  Filled 2014-10-01: qty 2

## 2014-10-01 MED ORDER — EPHEDRINE 5 MG/ML INJ
10.0000 mg | INTRAVENOUS | Status: DC | PRN
  Filled 2014-10-01: qty 2

## 2014-10-01 MED ORDER — DIPHENHYDRAMINE HCL 50 MG/ML IJ SOLN: 12.5000 mg | INTRAMUSCULAR | Status: DC | PRN

## 2014-10-01 MED ORDER — LACTATED RINGERS IV SOLN
500.0000 mL | Freq: Once | INTRAVENOUS | Status: AC
  Administered 2014-10-01: 500 mL via INTRAVENOUS

## 2014-10-01 MED ORDER — LIDOCAINE HCL (PF) 1 % IJ SOLN
INTRAMUSCULAR | Status: DC | PRN
  Administered 2014-10-01 (×2): 4 mL

## 2014-10-01 MED ORDER — LANOLIN HYDROUS EX OINT: TOPICAL_OINTMENT | CUTANEOUS | Status: DC | PRN

## 2014-10-01 MED ORDER — SIMETHICONE 80 MG PO CHEW: 80.0000 mg | CHEWABLE_TABLET | ORAL | Status: DC | PRN

## 2014-10-01 MED ORDER — FENTANYL 2.5 MCG/ML BUPIVACAINE 1/10 % EPIDURAL INFUSION (WH - ANES)
14.0000 mL/h | INTRAMUSCULAR | Status: DC | PRN
  Filled 2014-10-01: qty 125

## 2014-10-01 MED ORDER — ONDANSETRON HCL 4 MG/2ML IJ SOLN: 4.0000 mg | INTRAMUSCULAR | Status: DC | PRN

## 2014-10-01 MED ORDER — IBUPROFEN 600 MG PO TABS
600.0000 mg | ORAL_TABLET | Freq: Four times a day (QID) | ORAL | Status: DC
  Administered 2014-10-01 – 2014-10-03 (×8): 600 mg via ORAL
  Filled 2014-10-01 (×8): qty 1

## 2014-10-01 MED ORDER — SENNOSIDES-DOCUSATE SODIUM 8.6-50 MG PO TABS
2.0000 | ORAL_TABLET | ORAL | Status: DC
  Administered 2014-10-01 – 2014-10-02 (×2): 2 via ORAL
  Filled 2014-10-01 (×2): qty 2

## 2014-10-01 MED ORDER — PHENYLEPHRINE 40 MCG/ML (10ML) SYRINGE FOR IV PUSH (FOR BLOOD PRESSURE SUPPORT)
80.0000 ug | PREFILLED_SYRINGE | INTRAVENOUS | Status: DC | PRN
  Filled 2014-10-01: qty 20
  Filled 2014-10-01: qty 2

## 2014-10-01 NOTE — Anesthesia Procedure Notes (Signed)
Epidural Patient location during procedure: OB Start time: 10/01/2014 3:15 AM  Staffing Anesthesiologist: Milana Obey Performed by: anesthesiologist   Preanesthetic Checklist Completed: patient identified, site marked, surgical consent, pre-op evaluation, timeout performed, IV checked, risks and benefits discussed and monitors and equipment checked  Epidural Patient position: sitting Prep: site prepped and draped and DuraPrep Patient monitoring: continuous pulse ox and blood pressure Approach: midline Location: L3-L4 Injection technique: LOR saline  Needle:  Needle type: Tuohy  Needle gauge: 17 G Needle length: 9 cm and 9 Needle insertion depth: 5.5 cm Catheter type: closed end flexible Catheter size: 19 Gauge Catheter at skin depth: 10 cm Test dose: negative  Assessment Events: blood not aspirated, injection not painful, no injection resistance, negative IV test and no paresthesia  Additional Notes Patient identified. Risks/Benefits/Options discussed with patient including but not limited to bleeding, infection, nerve damage, paralysis, failed block, incomplete pain control, headache, blood pressure changes, nausea, vomiting, reactions to medication both or allergic, itching and postpartum back pain. Confirmed with bedside nurse the patient's most recent platelet count. Confirmed with patient that they are not currently taking any anticoagulation, have any bleeding history or any family history of bleeding disorders. Patient expressed understanding and wished to proceed. All questions were answered. Sterile technique was used throughout the entire procedure. Please see nursing notes for vital signs. Test dose was given through epidural catheter and negative prior to continuing to dose epidural or start infusion. Warning signs of high block given to the patient including shortness of breath, tingling/numbness in hands, complete motor block, or any concerning symptoms with  instructions to call for help. Patient was given instructions on fall risk and not to get out of bed. All questions and concerns addressed with instructions to call with any issues or inadequate analgesia.

## 2014-10-01 NOTE — Anesthesia Preprocedure Evaluation (Signed)
Anesthesia Evaluation  Patient identified by MRN, date of birth, ID band Patient awake    Reviewed: Allergy & Precautions, NPO status , Patient's Chart, lab work & pertinent test results  History of Anesthesia Complications Negative for: history of anesthetic complications  Airway Mallampati: II  TM Distance: >3 FB Neck ROM: Full    Dental no notable dental hx. (+) Dental Advisory Given   Pulmonary neg pulmonary ROS,  breath sounds clear to auscultation  Pulmonary exam normal       Cardiovascular negative cardio ROS  Rhythm:Regular Rate:Normal     Neuro/Psych PSYCHIATRIC DISORDERS Anxiety Depression negative neurological ROS     GI/Hepatic negative GI ROS, Neg liver ROS,   Endo/Other  negative endocrine ROS  Renal/GU negative Renal ROS  negative genitourinary   Musculoskeletal negative musculoskeletal ROS (+)   Abdominal   Peds negative pediatric ROS (+)  Hematology negative hematology ROS (+)   Anesthesia Other Findings   Reproductive/Obstetrics (+) Pregnancy                             Anesthesia Physical Anesthesia Plan  ASA: II  Anesthesia Plan: Epidural   Post-op Pain Management:    Induction:   Airway Management Planned:   Additional Equipment:   Intra-op Plan:   Post-operative Plan:   Informed Consent: I have reviewed the patients History and Physical, chart, labs and discussed the procedure including the risks, benefits and alternatives for the proposed anesthesia with the patient or authorized representative who has indicated his/her understanding and acceptance.   Dental advisory given  Plan Discussed with:   Anesthesia Plan Comments:         Anesthesia Quick Evaluation

## 2014-10-01 NOTE — Progress Notes (Signed)
UR chart review completed.  

## 2014-10-01 NOTE — H&P (Signed)
This is Dr. Gracy Racer dictating the history and physical on Heidi Ryan she's a 34 year old gravida 3 para 1011 at 87 weeks and 4 days [place] 2/27 16 admitted ruptured membranes which occurred at 9 PM last night positive GBS she got clindamycin IV and also received Pitocin stimulation throat night she is no fully dilated and the +2 station Past medical history negative Past surgical history negative Social history negative System review noncontributory Physical exam well-developed female in labor HEENT negative Lungs clear to P&A Heart regular rhythm no murmurs no gallops Breasts negative Abdomen term Pelvic as described above Extremities negative

## 2014-10-01 NOTE — Anesthesia Postprocedure Evaluation (Signed)
Anesthesia Post Note  Patient: Heidi Ryan  Procedure(s) Performed: * No procedures listed *  Anesthesia type: Epidural  Patient location: Mother/Baby  Post pain: Pain level controlled  Post assessment: Post-op Vital signs reviewed  Last Vitals:  Filed Vitals:   10/01/14 1109  BP: 112/65  Pulse: 96  Temp: 37.1 C  Resp: 18    Post vital signs: Reviewed  Level of consciousness:alert  Complications: No apparent anesthesia complications

## 2014-10-01 NOTE — Lactation Note (Signed)
This note was copied from the chart of Heidi Lindzy Mccoll. Lactation Consultation Note  P2.  BF older child for 9 months. Reviewed hand expression and good drops of colostrum expressed. Mother recently gave baby 44ml of pumped breastmilk. Assisted in latching baby in fb hold.  Reviewed breast massage to keep her active. Reviewed cluster feeding.  Mom encouraged to feed baby 8-12 times/24 hours and with feeding cues.  Mom made aware of O/P services, breastfeeding support groups, community resources, and our phone # for post-discharge questions.    Patient Name: Heidi Ryan [id number] Date: 10/01/2014 Reason for consult: Initial assessment   Maternal Data Has patient been taught Hand Expression?: Yes Does the patient have breastfeeding experience prior to this delivery?: Yes  Feeding Feeding Type: Breast Fed  LATCH Score/Interventions Latch: Repeated attempts needed to sustain latch, nipple held in mouth throughout feeding, stimulation needed to elicit sucking reflex.  Audible Swallowing: A few with stimulation  Type of Nipple: Everted at rest and after stimulation  Comfort (Breast/Nipple): Soft / non-tender     Hold (Positioning): Assistance needed to correctly position infant at breast and maintain latch.  LATCH Score: 7  Lactation Tools Discussed/Used     Consult Status Consult Status: Follow-up Date: 10/02/14 Follow-up type: In-patient    Vivianne Master [facility] 10/01/2014, 3:29 PM

## 2014-10-02 LAB — CBC
HCT: 31.8 % — ABNORMAL LOW (ref 36.0–46.0)
Hemoglobin: 10.5 g/dL — ABNORMAL LOW (ref 12.0–15.0)
MCH: 30.3 pg (ref 26.0–34.0)
MCHC: 33 g/dL (ref 30.0–36.0)
MCV: 91.6 fL (ref 78.0–100.0)
Platelets: 194 10*3/uL (ref 150–400)
RBC: 3.47 MIL/uL — AB (ref 3.87–5.11)
RDW: 13.7 % (ref 11.5–15.5)
WBC: 15.3 10*3/uL — ABNORMAL HIGH (ref 4.0–10.5)

## 2014-10-02 MED ORDER — HYDROMORPHONE HCL 2 MG PO TABS
1.0000 mg | ORAL_TABLET | ORAL | Status: DC | PRN
  Administered 2014-10-02: 1 mg via ORAL
  Filled 2014-10-02: qty 1

## 2014-10-02 NOTE — Lactation Note (Signed)
This note was copied from the chart of Heidi Ryan. Lactation Consultation Note  Patient Name: Heidi Ryan [id number] Date: 10/02/2014 Reason for consult: Follow-up assessment Baby 35 hours of life. Mom reports that she has decided to pump and give baby EBM, stating that she is not comfortable putting the baby to breast. Patient's RN, Elmyra Ricks, has already set mom up with DEBP and mom knows to pump every 3 hours for 15 minutes. Mom states that she is seeing a drop or two of colostrum when pumping. Enc mom to hand express after pumping and give any colostrum she gets directly to the baby. Mom states that she will continue to give formula until she has more colostrum/EBM for the baby. Enc mom to call for assistance as needed.   Maternal Data    Feeding    LATCH Score/Interventions                      Lactation Tools Discussed/Used     Consult Status Consult Status: Follow-up Date: 10/03/14 Follow-up type: In-patient    Inocente Salles 10/02/2014, 4:05 PM

## 2014-10-02 NOTE — Progress Notes (Signed)
Patient states the Dilaudid is working "much better" for her pain.  No questions at this time.

## 2014-10-02 NOTE — Progress Notes (Signed)
Patient ID: Heidi Ryan, female   DOB: 05-Nov-1980, 33 y.o.   MRN: [id number] Postpartum day one Vital signs normal Fundus firm Lochia moderate Doing well

## 2014-10-02 NOTE — Progress Notes (Signed)
Clinical Social Work Department PSYCHOSOCIAL ASSESSMENT - MATERNAL/CHILD 10/02/2014  Patient:  Heidi Ryan,Heidi Ryan  Account Number:  [id number]  Admit Date:  09/30/2014  Childs Name:   Heidi Ryan   Clinical Social Worker:  Jyron Turman, CLINICAL SOCIAL WORKER   Date/Time:  10/02/2014 10:00 AM  Date Referred:  10/01/2014   Referral source  Central Nursery     Referred reason  Depression/Anxiety   Other referral source:    I:  FAMILY / HOME ENVIRONMENT Child'Ryan legal guardian:  PARENT  Guardian - Name Guardian - Age Guardian - Address  Heidi Ryan 33 [address] [place], [place] [address]  Heidi Ryan  different residence   Other household support members/support persons Name Relationship DOB   DAUGHTER 13 years old   Other support:   MOB reported that her family is displaying mixed levels of support.  She shared that her family has previously shared that they are not supportive of her pregnancy, but they have also been providing her with extra support and present at the hospital.    II  PSYCHOSOCIAL DATA Information Source:  Patient Interview  Financial and Community Resources Employment:   MOB stated that she is employed. She shared that she has been on bed rest since early December and been unable to work.  She reported intention to return to previous employer, but also stated that she may look for a new job.   Financial resources:  Medicaid If Medicaid - County:  [place] Other  WIC  Food Stamps   School / Grade:  N/A Maternity Care Coordinator / Child Services Coordination / Early Interventions:   None reported  Cultural issues impacting care:   None reported    III  STRENGTHS Strengths  Adequate Resources  Home prepared for Child (including basic supplies)   Strength comment:    IV  RISK FACTORS AND CURRENT PROBLEMS Current Problem:  YES   Risk Factor & Current Problem Patient Issue Family Issue Risk Factor / Current Problem Comment  Mental Illness Y N MOB  presents with history of depression (for more than 10 years), but reported increase of symptoms during the pregnancy due to numerous psychosocial stressors.    V  SOCIAL WORK ASSESSMENT CSW met with the MOB due to history of depression.  MOB was seen by CSW, Cumi Bevel, during antenatal admission in December.  CSW reviewed chart prior to meeting with the MOB.  MOB presented as receptive and easily engaged.  She required minimal rapport building before discussing her thoughts and feelings as she transitions to the postpartum period.  MOB displayed an appropriate range in affect, was in a pleasant mood, with no acute mental health symptoms observed or noted in her thought process.  She verbalized appreciation for the support, and was observed to be interacting and bonding with the infant.   Per CSW note in December, MOB presents with a history of depression, including participating in therapy from 2002-2006.  At that time, MOB reported increase in symptoms during the pregnancy due to numerous stressors.    During this visit/assessment, CSW provided MOB an opportunity to reflect upon and process her pregnancy narrative. The MOB readily discussed how she perceived her family to be minimally supportive (made comments about how she should have had children when she was younger).  She discussed that she was also overwhelmed since there were concerns about having fibroids (but issues have since been resolved), and she was receiving pressure from her family to terminate the pregnancy.  The   MOB shared that stress increased again when she was hospitalized for preterm labor which led to increase in anxiety since she was concerned that the infant would be premature.  The MOB presented with insight and awareness of how these events have contributed to her symptoms of depression, but she shared that she feels much "better" now that the infant is born and healthy.  She recognized that during the pregnancy it was difficult  to reflect upon positive aspects of her life since everything felt negative and outside of her control; however, she is now sharing that she is hopeful that her situations will improve since she will be able to return to work soon (has not been able to work since December), she has plans to move out of her mother'Ryan home to her own place, and she will physically be able to get back to her previous level of functioning.   The MOB also reported a positive experience while in L&D, and discussed perceptions that she breastfeeding and skin-to-skin are positive experiences for her.  She did report that she is having difficulties with breastfeeding, but she recognized that it is a learning process for both her and the infant, and that she will continue to have opportunities to work with lactation specialists prior to discharge.   Overall, the MOB reported that she is feeling positive and hopeful about her transition into the postpartum period.  She presents with a sense of self-efficacy as she discussed her ability to take it "one day at a time" and to "pray" to help her when she begins to feel stressed and overwhelmed.  MOB also presented with a positive thought process/outlook as she reflected upon the positive aspects in her life and what she is working towards (healthy infant, own housing, returning to work).  MOB received education on postpartum depression while on the antenatal unit, but was receptive to review of education.  She acknowledged increased risk for developing symptoms given stressors, and she shared belief that she can speak with her MD if she notes symptoms.   MOB denied additional questions, concerns, or needs.  She agreed to contact CSW as needs arise during admission.   No barriers to discharge.  VI SOCIAL WORK PLAN Social Work Plan  Patient/Family Education  No Further Intervention Required / No Barriers to Discharge   Type of pt/family education:   Postpartum depression   If child  protective services report - county:  N/A If child protective services report - date:  N/A Information/referral to community resources comment:   No needs identified.   Other social work plan:   CSW to follow up as needed or upon MOB request.     

## 2014-10-02 NOTE — Progress Notes (Signed)
Patient c/o pain and that Motrin "isn't helping enough and I'm allergic to Tylenol and cannot take Percocet". During shift change, I was reported to by night RN that patient discussed need for another pain medicine because she cannot take Percocet. Patient did not inform Dr. Ruthann Cancer this morning when he rounded. At this time, I informed patient that I will call Dr. Ruthann Cancer regarding pain medicine due to her request. I spoke to Dr. Ruthann Cancer and received an order for 1 mg Dilaudid every 4 hours, prn.

## 2014-10-03 NOTE — Discharge Instructions (Signed)
Discharge instructions   You can wash your hair  Shower  Eat what you want  Drink what you want  See me in 6 weeks  Your ankles are going to swell more in the next 2 weeks than when pregnant  No sex for 6 weeks   Paytin Ramakrishnan A, MD 10/03/2014

## 2014-10-03 NOTE — Discharge Summary (Signed)
Obstetric Discharge Summary Reason for Admission: onset of labor Prenatal Procedures: none Intrapartum Procedures: spontaneous vaginal delivery Postpartum Procedures: none Complications-Operative and Postpartum: none HEMOGLOBIN  Date Value Ref Range Status  10/02/2014 10.5* 12.0 - 15.0 g/dL Final   HCT  Date Value Ref Range Status  10/02/2014 31.8* 36.0 - 46.0 % Final    Physical Exam:  General: alert Lochia: appropriate Uterine Fundus: firm Incision: healing well DVT Evaluation: No evidence of DVT seen on physical exam.  Discharge Diagnoses: Term Pregnancy-delivered  Discharge Information: Date: 10/03/2014 Activity: pelvic rest Diet: routine Medications: Percocet Condition: stable Instructions: refer to practice specific booklet Discharge to: home Follow-up Information    Follow up with Heidi Findling A, MD In 6 weeks.   Specialty:  Obstetrics and Gynecology   Contact information:   [address] [place] [address] [phone]       Newborn Data: Live born female  Birth Weight: 8 lb 11.9 oz (3965 g) APGAR: 9, 9  Home with mother.  Heidi Ryan Ryan 10/03/2014, 6:30 AM

## 2014-11-27 ENCOUNTER — Emergency Department (HOSPITAL_COMMUNITY)
Admission: EM | Admit: 2014-11-27 | Discharge: 2014-11-27 | Disposition: A | Discharge status: Home / Self Care | Payer: Medicaid Other | Attending: Emergency Medicine | Admitting: Emergency Medicine

## 2014-11-27 ENCOUNTER — Encounter (HOSPITAL_COMMUNITY): Payer: Self-pay | Admitting: Family Medicine

## 2014-11-27 DIAGNOSIS — Y998 Other external cause status: Secondary | ICD-10-CM | POA: Insufficient documentation

## 2014-11-27 DIAGNOSIS — Z880 Allergy status to penicillin: Secondary | ICD-10-CM | POA: Diagnosis not present

## 2014-11-27 DIAGNOSIS — Z8742 Personal history of other diseases of the female genital tract: Secondary | ICD-10-CM | POA: Diagnosis not present

## 2014-11-27 DIAGNOSIS — Y9289 Other specified places as the place of occurrence of the external cause: Secondary | ICD-10-CM | POA: Insufficient documentation

## 2014-11-27 DIAGNOSIS — W101XXA Fall (on)(from) sidewalk curb, initial encounter: Secondary | ICD-10-CM | POA: Insufficient documentation

## 2014-11-27 DIAGNOSIS — Z8659 Personal history of other mental and behavioral disorders: Secondary | ICD-10-CM | POA: Insufficient documentation

## 2014-11-27 DIAGNOSIS — Y9389 Activity, other specified: Secondary | ICD-10-CM | POA: Insufficient documentation

## 2014-11-27 DIAGNOSIS — W19XXXA Unspecified fall, initial encounter: Secondary | ICD-10-CM

## 2014-11-27 DIAGNOSIS — S0181XA Laceration without foreign body of other part of head, initial encounter: Secondary | ICD-10-CM | POA: Insufficient documentation

## 2014-11-27 MED ORDER — TETANUS-DIPHTH-ACELL PERTUSSIS 5-2.5-18.5 LF-MCG/0.5 IM SUSP
0.5000 mL | Freq: Once | INTRAMUSCULAR | Status: AC
  Administered 2014-11-27: 0.5 mL via INTRAMUSCULAR
  Filled 2014-11-27: qty 0.5

## 2014-11-27 MED ORDER — LIDOCAINE-EPINEPHRINE 2 %-1:100000 IJ SOLN
10.0000 mL | Freq: Once | INTRAMUSCULAR | Status: AC
  Administered 2014-11-27: 10 mL via INTRADERMAL
  Filled 2014-11-27: qty 1

## 2014-11-27 MED ORDER — OXYCODONE HCL 5 MG PO TABS
5.0000 mg | ORAL_TABLET | Freq: Once | ORAL | Status: AC
Start: 2014-11-27 — End: 2014-11-27
  Administered 2014-11-27: 5 mg via ORAL
  Filled 2014-11-27: qty 1

## 2014-11-27 NOTE — ED Provider Notes (Signed)
CSN: [id number]     Arrival date & time 11/27/14  1935 History  This chart was scribed for non-physician provider Quincy Carnes, PA-C, working with Carmin Muskrat, MD by Irene Pap, ED Scribe. This patient was seen in room WTR7/WTR7 and patient care was started at 8:08 PM.    Chief Complaint  Patient presents with  . Facial Laceration   The history is provided by the patient. No language interpreter was used.  HPI Comments: Heidi Ryan is a 34 y.o. female who presents to the Emergency Department complaining of a left forehead laceration about 3 hours ago. She states that she was walking on uneven ground and twisted her ankle and fell on the sidewalk. She states that her head was bleeding badly and had not stopped until she came to the ED. She denies LOC, dizziness, nausea, or vomiting. She denies other injuries from fall.  Denies any currently ankle pain or difficulty walking at this time.  Date of last tetanus 2008.  Past Medical History  Diagnosis Date  . Fibroid   . Depression    Past Surgical History  Procedure Laterality Date  . No past surgeries     Family History  Problem Relation Age of Onset  . Alcohol abuse Neg Hx   . Birth defects Neg Hx   . COPD Neg Hx   . Depression Neg Hx   . Diabetes Neg Hx   . Drug abuse Neg Hx   . Early death Neg Hx   . Hearing loss Neg Hx   . Heart disease Neg Hx   . Hyperlipidemia Neg Hx   . Kidney disease Neg Hx   . Learning disabilities Neg Hx   . Mental illness Neg Hx   . Mental retardation Neg Hx   . Miscarriages / Stillbirths Neg Hx   . Stroke Neg Hx   . Vision loss Neg Hx   . Varicose Veins Neg Hx   . Arthritis Mother   . Cancer Mother   . Hypertension Father   . Asthma Daughter   . Cancer Maternal Grandmother    History  Substance Use Topics  . Smoking status: Never Smoker   . Smokeless tobacco: Never Used  . Alcohol Use: No   OB History    Gravida Para Term Preterm AB TAB SAB Ectopic Multiple Living   3 2 2  0 1   1 0 0 2     Review of Systems  Gastrointestinal: Negative for nausea and vomiting.  Skin: Positive for wound.  Neurological: Negative for dizziness and syncope.  All other systems reviewed and are negative.  Allergies  Penicillins and Tylenol  Home Medications   Prior to Admission medications   Not on File   BP 132/86 mmHg  Pulse 93  Temp(Src) 99 F (37.2 C) (Oral)  Resp 20  Ht 5\' 7"  (1.702 m)  Wt 170 lb (77.111 kg)  BMI 26.62 kg/m2  SpO2 100%   Physical Exam  Constitutional: She is oriented to person, place, and time. She appears well-developed and well-nourished.  HENT:  Head: Normocephalic. Head is with laceration.    Mouth/Throat: Oropharynx is clear and moist.  2 cm laceration to left forehead with asymmetrical edges. No active bleeding. No noted scalp hematoma or skull deformity; no FB or signs of infection  Eyes: Conjunctivae and EOM are normal. Pupils are equal, round, and reactive to light.  Neck: Normal range of motion.  Cardiovascular: Normal rate, regular rhythm and normal heart sounds.  Pulmonary/Chest: Effort normal and breath sounds normal.  Musculoskeletal: Normal range of motion.  Neurological: She is alert and oriented to person, place, and time.  AAOx3, answering questions appropriately; equal strength UE and LE bilaterally; CN grossly intact; moves all extremities appropriately without ataxia; no focal neuro deficits or facial asymmetry appreciated  Skin: Skin is warm and dry.     Psychiatric: She has a normal mood and affect.  Nursing note and vitals reviewed.   ED Course  Procedures (including critical care time)  LACERATION REPAIR Performed by: Larene Pickett Authorized by: Larene Pickett Consent: Verbal consent obtained. Risks and benefits: risks, benefits and alternatives were discussed Consent given by: patient Patient identity confirmed: provided demographic data Prepped and Draped in normal sterile fashion Wound  explored  Laceration Location: left forehead  Laceration Length: 2cm  No Foreign Bodies seen or palpated  Anesthesia: local infiltration  Local anesthetic: lidocaine 1% with epinephrine  Anesthetic total: 5 ml  Irrigation method: syringe Amount of cleaning: standard  Skin closure: 5-0 vicryl  Number of sutures: 2  Technique: simple interrupted  Patient tolerance: Patient tolerated the procedure well with no immediate complications.  DIAGNOSTIC STUDIES: Oxygen Saturation is 100% on room air, normal by my interpretation.    COORDINATION OF CARE: 8:22 PM-Discussed treatment plan which includes wound care with pt at bedside and pt agreed to plan.   Labs Review Labs Reviewed - No data to display  Imaging Review No results found.   EKG Interpretation None      MDM   Final diagnoses:  Fall, initial encounter  Facial laceration, initial encounter   34 year old female with left facial laceration from a fall. She denies loss of consciousness. Patient is neurologically intact.  She denies any ankle pain at this time, ambulatory without difficulty.  Tetanus was updated. Laceration repaired as above, patient tolerated well. She'll be discharged home with supportive care. Encouraged home wound care daily. She is to follow with her PCP in one week for suture removal.  Discussed plan with patient, he/she acknowledged understanding and agreed with plan of care.  Return precautions given for new or worsening symptoms.  I personally performed the services described in this documentation, which was scribed in my presence. The recorded information has been reviewed and is accurate.  Larene Pickett, PA-C 11/27/14 2119  Carmin Muskrat, MD 11/28/14 [phone]

## 2014-11-27 NOTE — ED Notes (Signed)
Patient states she was on unlevel ground with bricks imbed in the grown. Pt reports she fell. Pt has a laceration to her right forehead. Denies no LOC. Pt is applying direct pressure to laceration.

## 2014-11-27 NOTE — Discharge Instructions (Signed)
Keep sutures clean and dry.  They will need to be removed in 1 week, your primary care physician can do this for you. Return here as needed for new concerns.

## 2015-09-13 IMAGING — US US ABDOMEN COMPLETE
1 series · 14 of 25 positions shown · non-contrast
Comparison: None.

CLINICAL DATA: Lower abdominal pain.

EXAM:
ULTRASOUND ABDOMEN COMPLETE

[Series 1: us abdomen complete · 0.22mm/px · 14 of 89 slices shown]
[im 1/89]
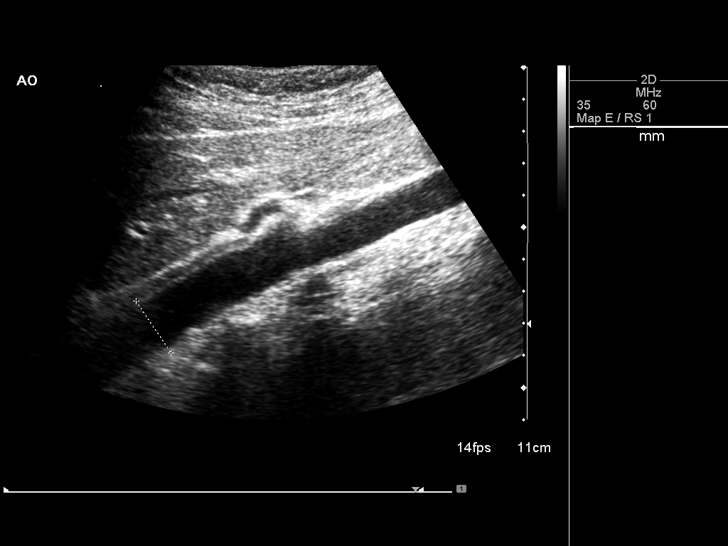
[im 8/89]
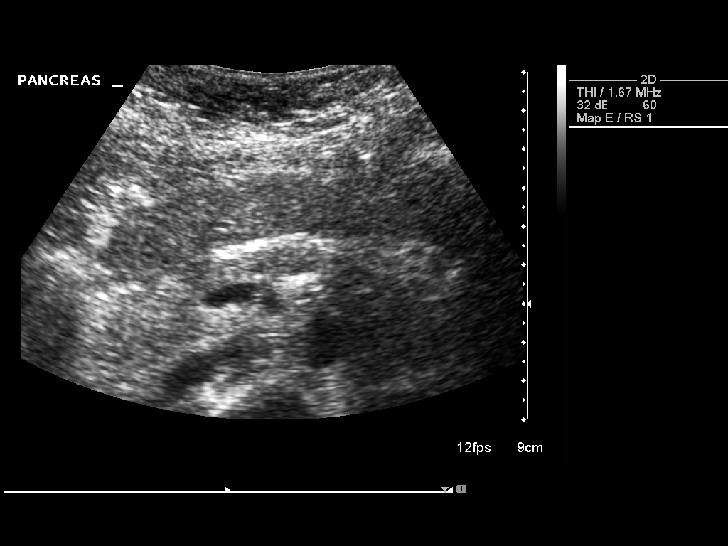
[im 15/89]
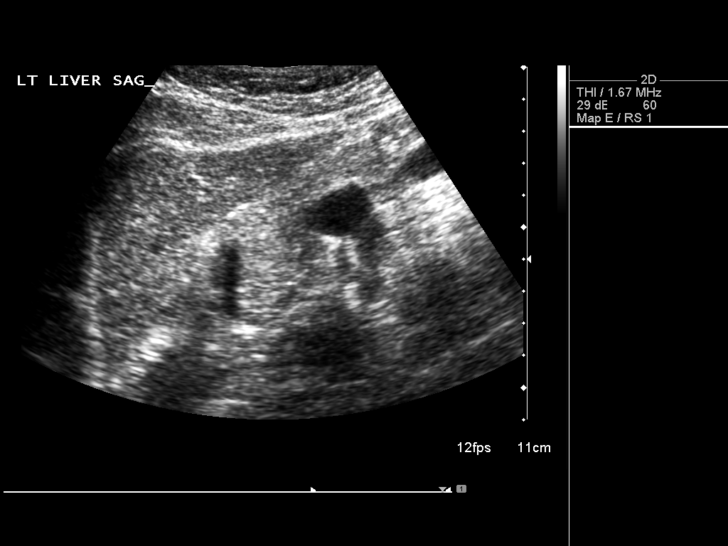
[im 23/89]
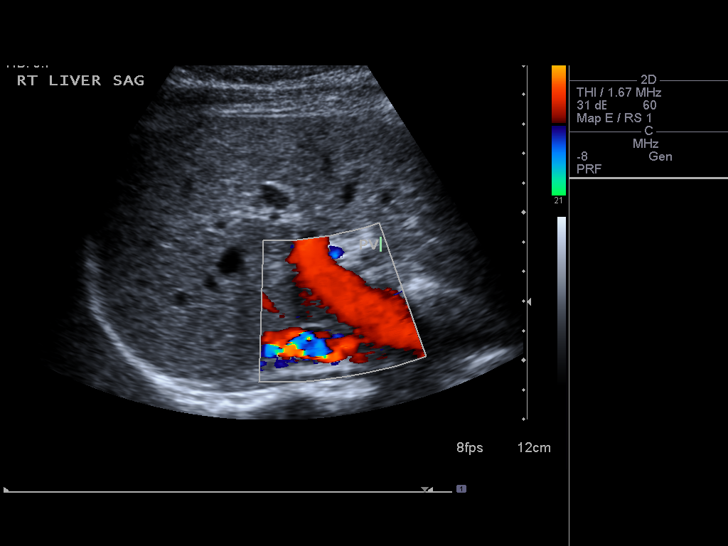
[im 30/89]
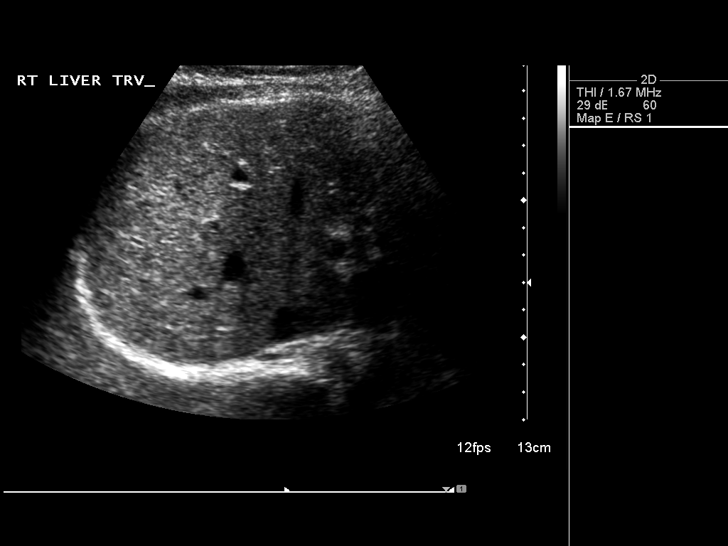
[im 34/89]
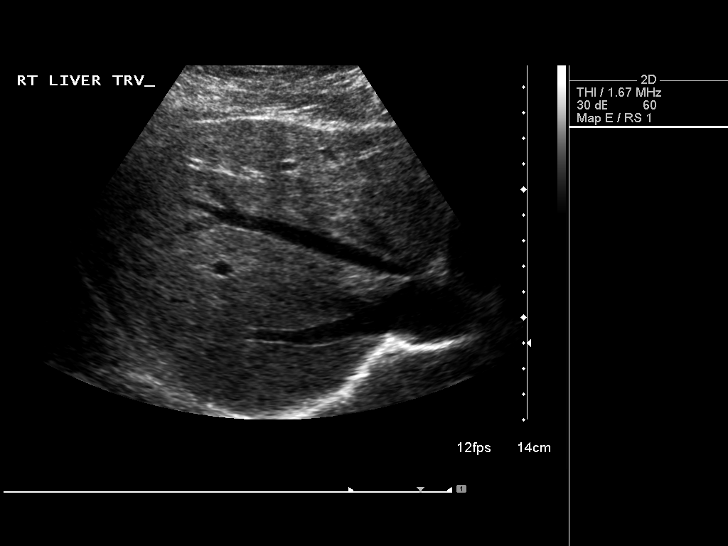
[im 41/89]
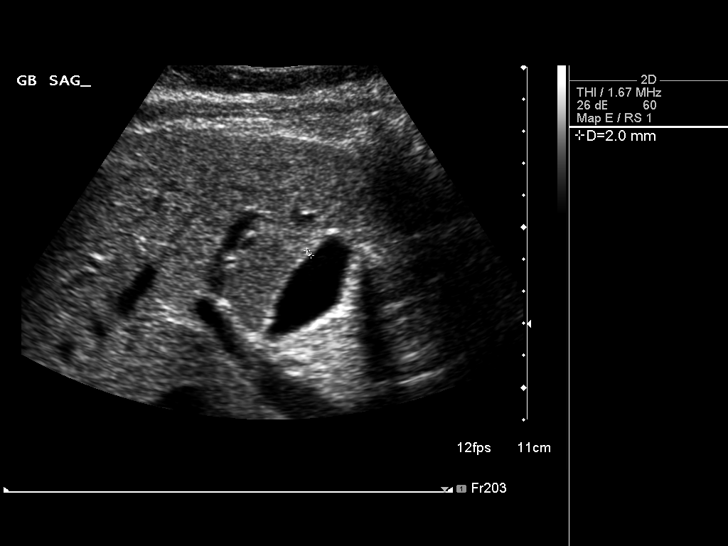
[im 48/89]
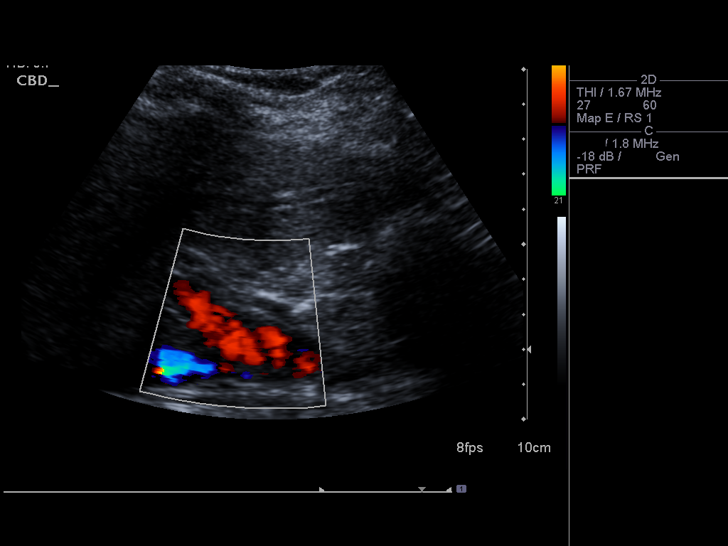
[im 56/89]
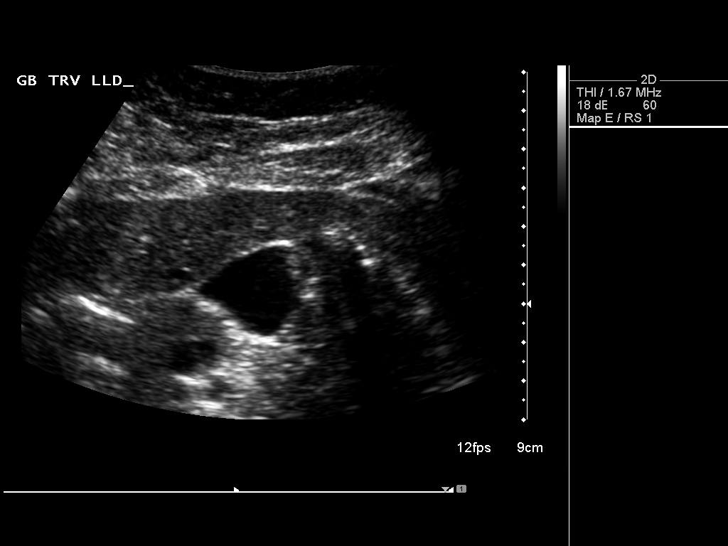
[im 59/89]
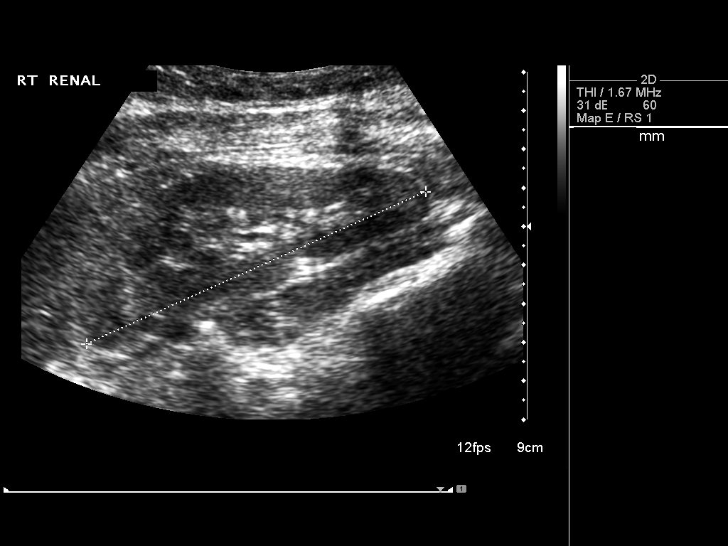
[im 67/89]
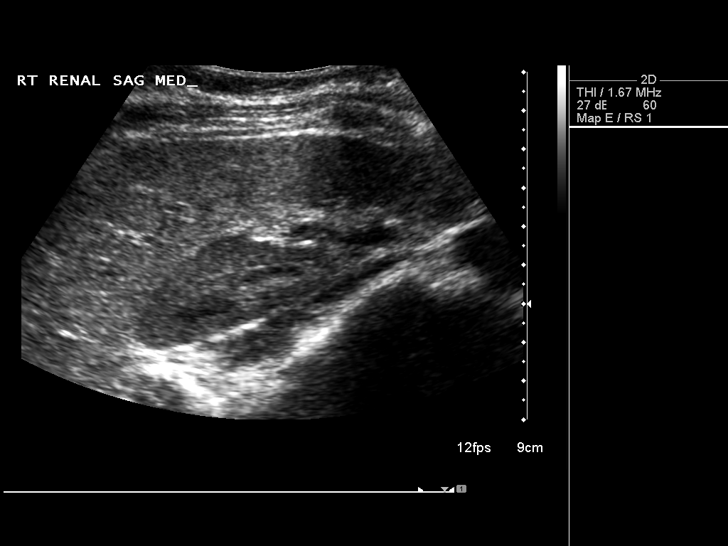
[im 74/89]
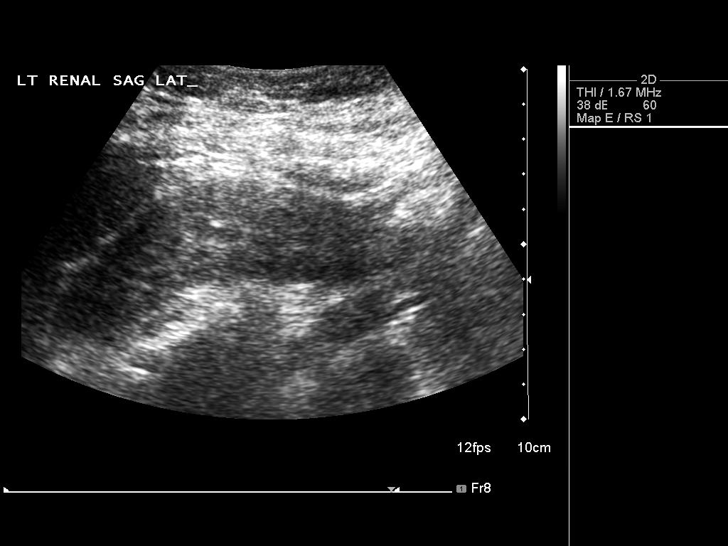
[im 81/89]
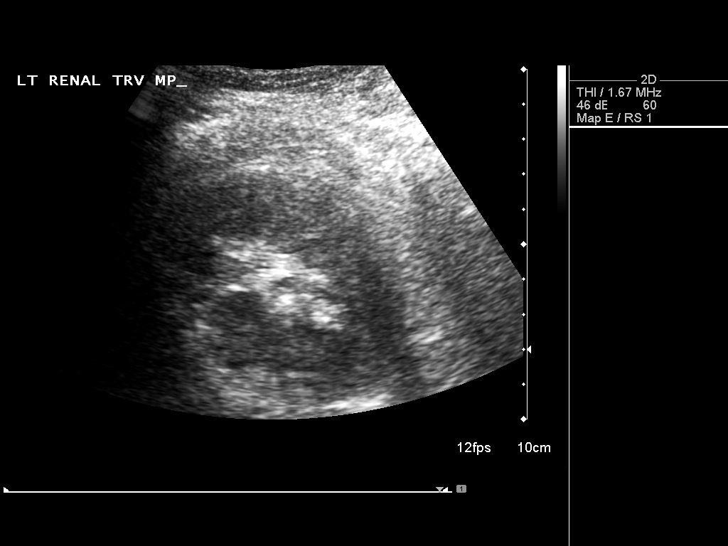
[im 89/89]
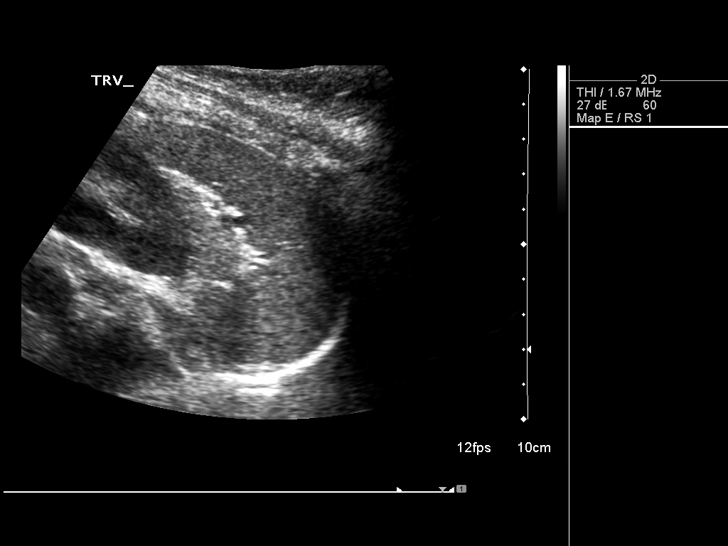

[14 of 25 positions shown; findings below may reference images not displayed]

FINDINGS: Gallbladder:

No gallstones or wall thickening visualized. No sonographic Murphy
sign noted.

Common bile duct:

Diameter: Normal caliber, 4 mm

Liver:

No focal lesion identified. Within normal limits in parenchymal
echogenicity.

IVC:

No abnormality visualized.

Pancreas:

Visualized portion unremarkable.

Spleen:

Size and appearance within normal limits.

Right Kidney:

Length: 10.0 cm. Echogenicity within normal limits. No mass or
hydronephrosis visualized.

Left Kidney:

Length: 10.6 cm. Echogenicity within normal limits. No mass or
hydronephrosis visualized.

Abdominal aorta:

No aneurysm visualized.

Other findings:

None.
IMPRESSION: Unremarkable abdominal ultrasound.

## 2016-04-02 IMAGING — US US OB FOLLOW-UP
1 series · 12 of 28 positions shown · non-contrast
Comparison: none

[Series 1: us ob follow-up · 0.23mm/px · 12 of 40 slices shown]
[im 2/40]
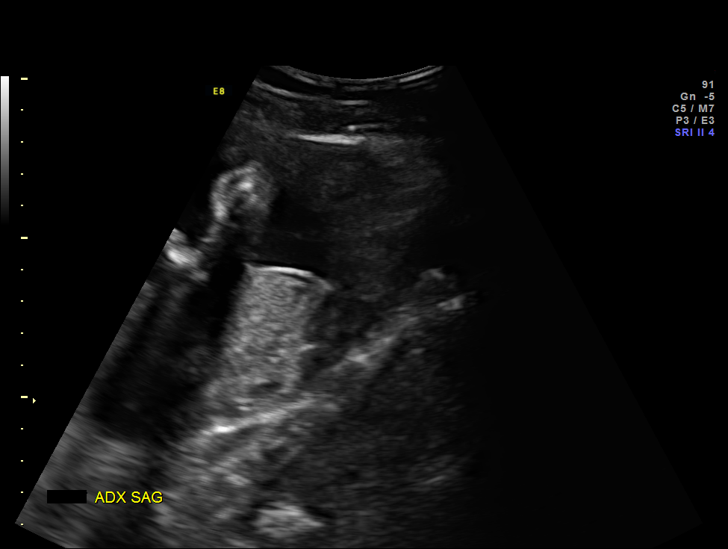
[im 5/40]
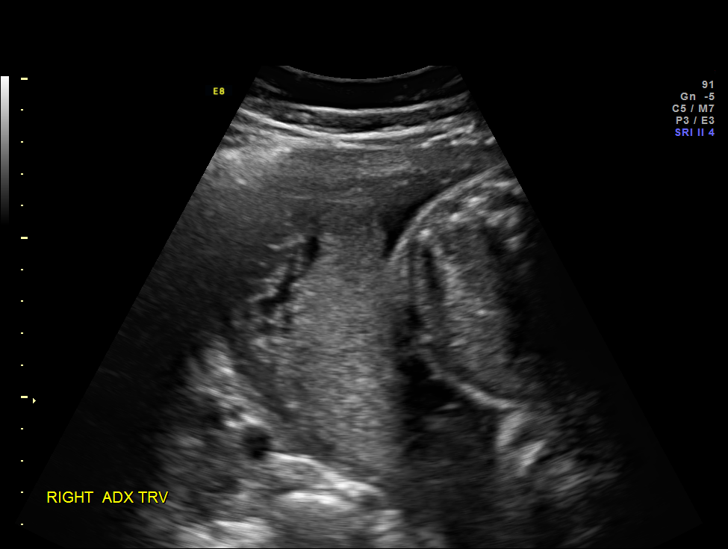
[im 8/40]
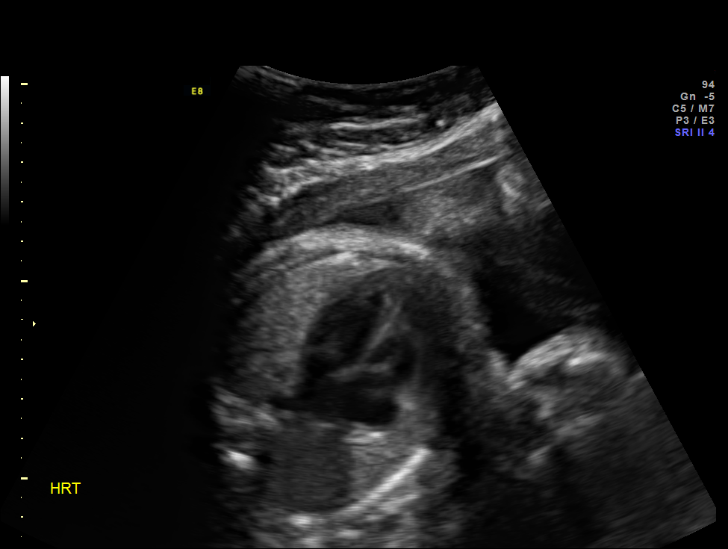
[im 12/40]
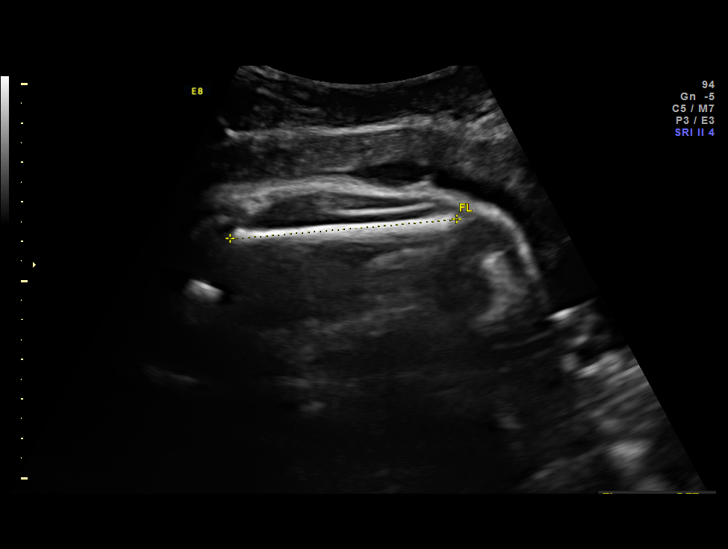
[im 15/40]
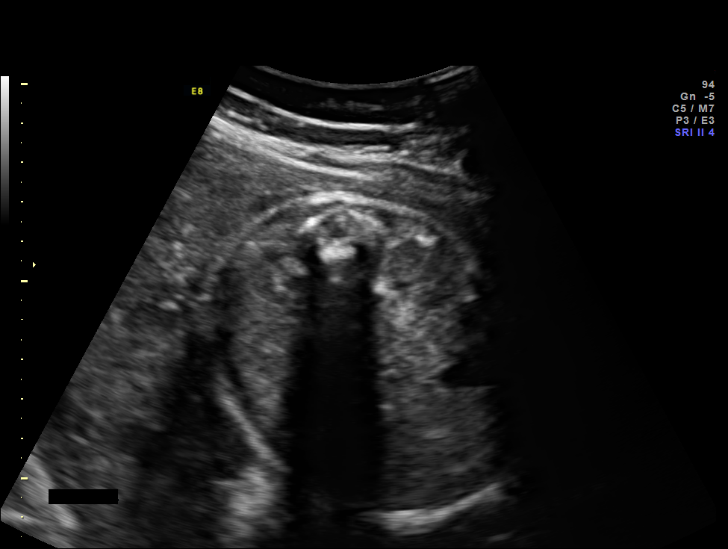
[im 18/40]
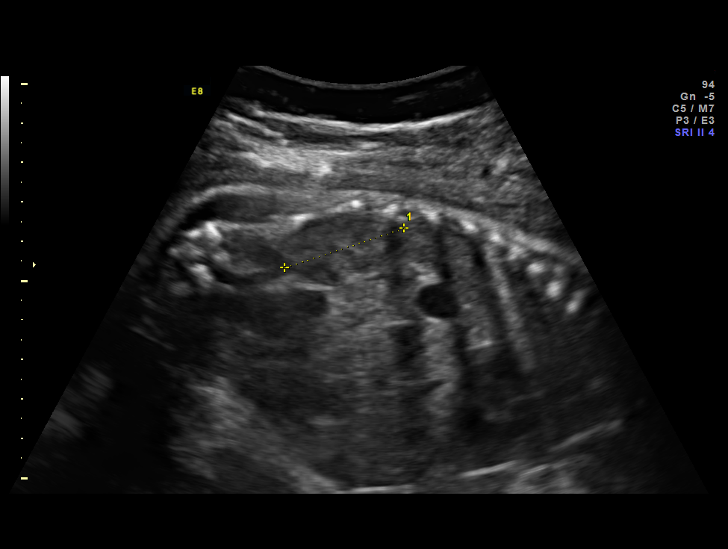
[im 22/40]
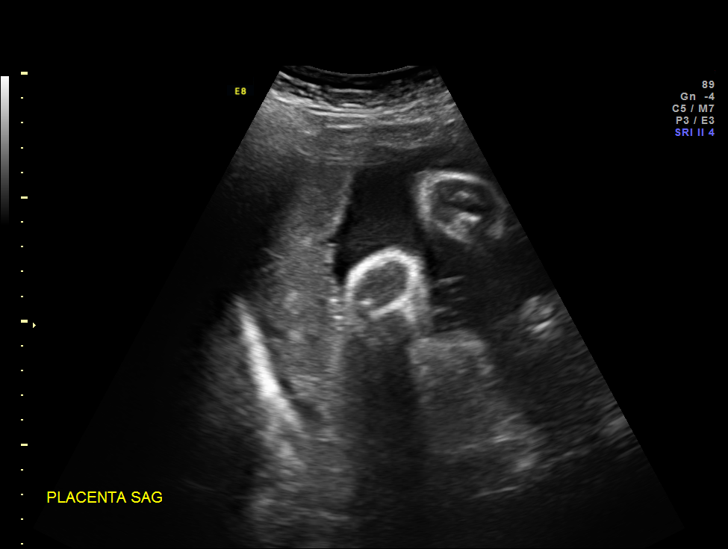
[im 25/40]
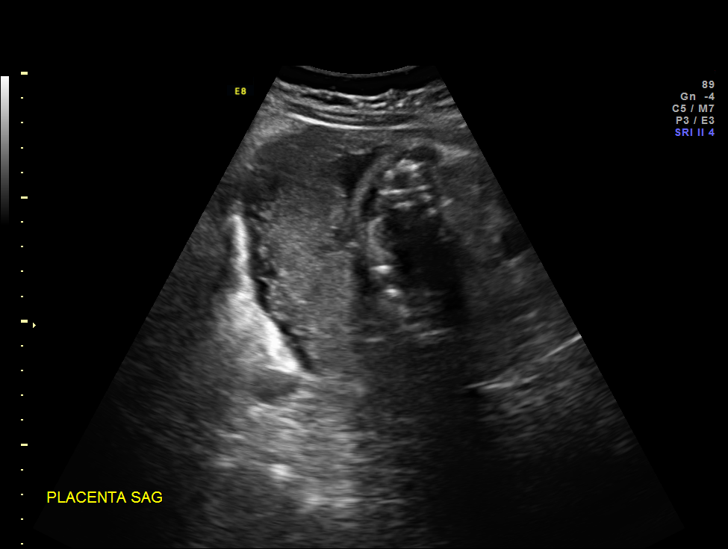
[im 28/40]
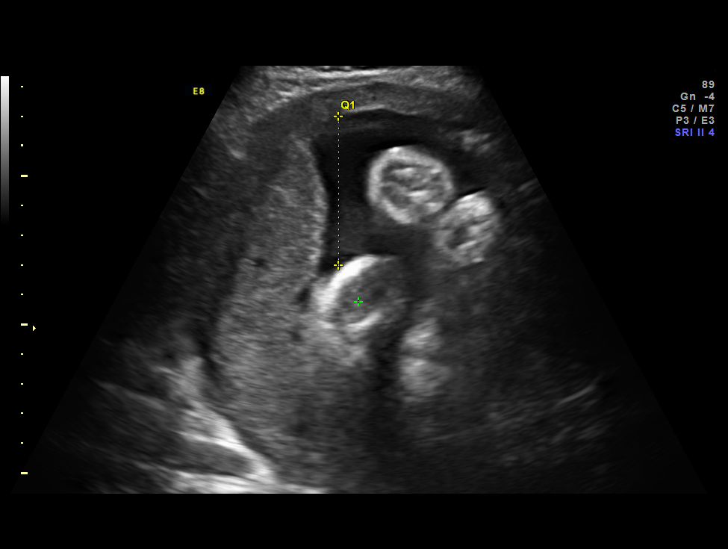
[im 32/40]
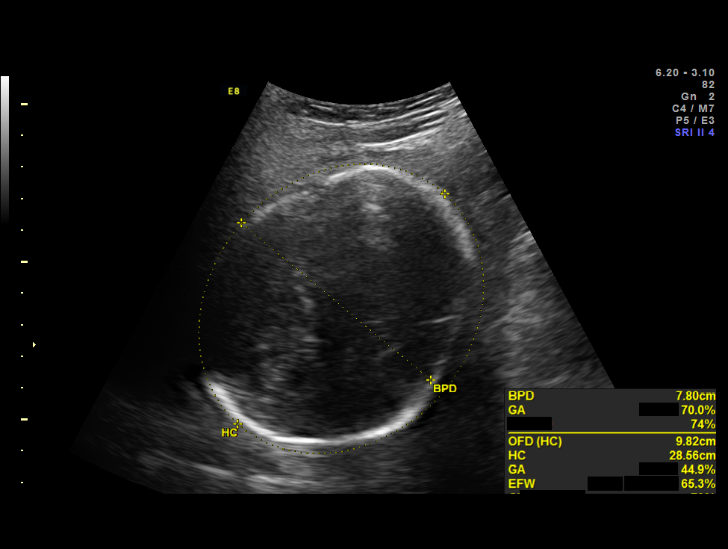
[im 35/40]
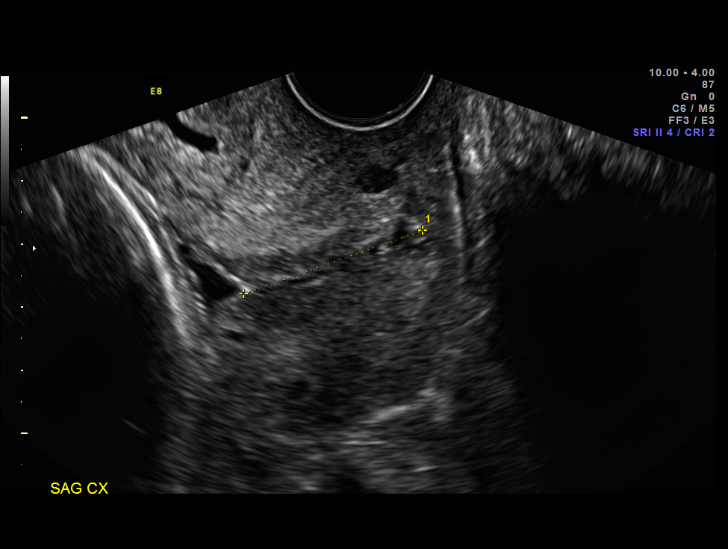
[im 38/40]
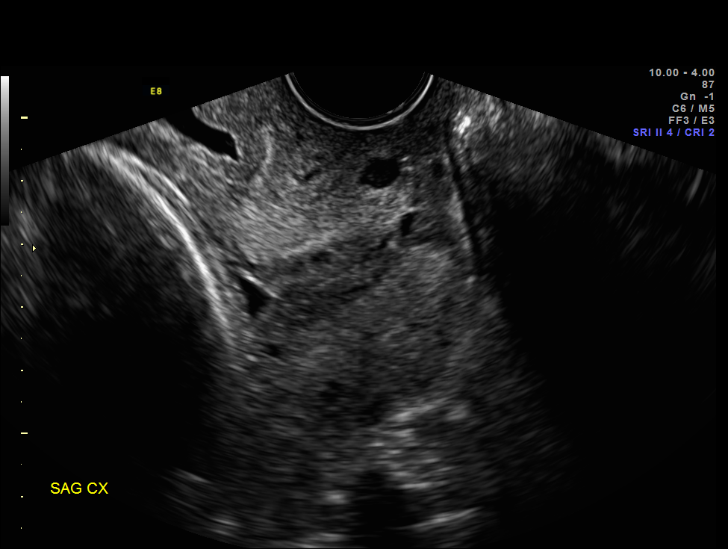

[12 of 28 positions shown; findings below may reference images not displayed]

OBSTETRICS REPORT
                      (Signed Final 07/29/2014 [DATE])

Service(s) Provided

 US OB FOLLOW UP                                       76816.1
 US MFM OB TRANSVAGINAL                                76817.2
Indications

 30 weeks gestation of pregnancy
 Cervical shortening, 3rd
 Follow-up incomplete fetal anatomic evaluation        Z36
Fetal Evaluation

 Num Of Fetuses:    1
 Fetal Heart Rate:  161                          bpm
 Cardiac Activity:  Observed
 Presentation:      Cephalic
 Placenta:          Posterior, above cervical
                    os

 Amniotic Fluid
 AFI FV:      Subjectively within normal limits
 AFI Sum:     13.95    cm      46  %Tile      Larg Pckt:   5.02  cm
 RUQ:   5.02    cm   RLQ:    3.14   cm    LUQ:    4.34   cm    LLQ:   1.45    cm
Biometry

 BPD:       78   mm    G. Age:  31w 2d                CI:          79.4   70 - 86
 OFD:     98.2   mm                                   FL/HC:       20.3   19.2 -

 HC:     285.6   mm    G. Age:  31w 3d       45   %   HC/AC:       1.04   0.99 -

 AC:     274.5   mm    G. Age:  31w 4d       79   %   FL/BPD:      74.5   71 - 87
 FL:      58.1   mm    G. Age:  30w 2d       38   %   FL/AC:       21.2   20 - 24

 Est. FW:    2636   gm    3 lb 12 oz     70  %
Gestational Age

 LMP:           28w 2d        Date:  01/11/14                 EDD:    10/18/14
 U/S Today:     31w 1d                                        EDD:    09/28/14
 Best:          30w 2d     Det. By:  Early Ultrasound         EDD:    10/04/14
                                     (02/18/14)
Anatomy
 Cranium:          Appears normal         Aortic Arch:       Previously seen
 Fetal Cavum:      Previously seen        Ductal Arch:       Previously seen
 Ventricles:       Previously seen        Diaphragm:         Appears normal
 Choroid Plexus:   Previously seen        Stomach:           Appears normal, left
                                                             sided
 Cerebellum:       Not well visualized    Abdomen:           Appears normal
 Posterior Fossa:  Not well visualized    Abdominal Wall:    Previously seen
 Nuchal Fold:      Not applicable (>20    Cord Vessels:      Previously seen
                   wks GA)
 Face:             Orbits previously      Kidneys:           Appear normal
                   seen
 Lips:             Previously seen        Bladder:           Appears normal
 Heart:            Appears normal         Spine:             Previously seen
                   (4CH, axis, and
                   situs)
 RVOT:             Previously seen        Lower              Previously seen
                                          Extremities:
 LVOT:             Previously seen        Upper              Previously seen
                                          Extremities:

 Other:  Heels previously visualized.  Fetus appears to be a female.
         Technically difficult due to fetal position.
Cervix Uterus Adnexa

 Cervical Length:    2.5       cm

 Cervix:       Measured transvaginally.

 Adnexa:     No abnormality visualized.
Impression

 SIUP at 30+2 weeks
 Normal interval anatomy; anatomic survey complete except
 for PF
 Normal amniotic fluid volume
 Appropriate interval growth with EFW at the 70th %tile
 EV views of cervix: mild funneling with distal closed portion
 measuring 2.5 - 3.1 cms
Recommendations

 Continue to follow and evaluate cervix clinically

 questions or concerns.

## 2016-04-26 LAB — LAB REPORT - SCANNED: Pap: NEGATIVE

## 2018-07-20 ENCOUNTER — Other Ambulatory Visit (HOSPITAL_COMMUNITY)
Admission: RE | Admit: 2018-07-20 | Discharge: 2018-07-20 | Disposition: A | Discharge status: Home / Self Care | Payer: 59 | Source: Ambulatory Visit | Attending: Obstetrics & Gynecology | Admitting: Obstetrics & Gynecology

## 2018-07-20 ENCOUNTER — Encounter: Payer: Self-pay | Admitting: Obstetrics & Gynecology

## 2018-07-20 ENCOUNTER — Ambulatory Visit (INDEPENDENT_AMBULATORY_CARE_PROVIDER_SITE_OTHER): Payer: 59 | Admitting: Obstetrics & Gynecology

## 2018-07-20 VITALS — BP 117/74 | HR 77 | Ht 67.0 in | Wt 177.0 lb

## 2018-07-20 DIAGNOSIS — Z113 Encounter for screening for infections with a predominantly sexual mode of transmission: Secondary | ICD-10-CM | POA: Diagnosis not present

## 2018-07-20 DIAGNOSIS — Z01419 Encounter for gynecological examination (general) (routine) without abnormal findings: Secondary | ICD-10-CM | POA: Insufficient documentation

## 2018-07-20 NOTE — Progress Notes (Signed)
Subjective:     Shaneece BRIENA SWINGLER is a 37 y.o. female here for a routine exam. [id number] LMP occ on Mirena.  Mirena placed 01/2018. Current complaints: pt had chlamydia in May 2019     Mother had ovarian and uterus cancer at age 61 years.     Gynecologic History No LMP recorded. Contraception: IUD Last Pap: Sept. Results were: normal Last mammogram: n/a   Obstetric History OB History  Gravida Para Term Preterm AB Living  3 2 2  0 1 2  SAB TAB Ectopic Multiple Live Births  1   0 0 2    # Outcome Date GA Lbr Len/2nd Weight Sex Delivery Anes PTL Lv  3 Term 10/01/14 [redacted]w[redacted]d 03:58 / 00:28 8 lb 11.9 oz (3.965 kg) F Vag-Spont EPI  LIV  2 Term 2003     Vag-Spont   LIV  1 SAB            The following portions of the patient's history were reviewed and updated as appropriate: allergies, current medications, past family history, past medical history, past social history, past surgical history and problem list.  Review of Systems Pertinent items are noted in HPI.    Objective:  BP 117/74   Pulse 77   Ht 5\' 7"  (1.702 m)   Wt 177 lb (80.3 kg)   BMI 27.72 kg/m   General Appearance:    Alert, cooperative, no distress, appears stated age  Head:    Normocephalic, without obvious abnormality, atraumatic  Eyes:    conjunctiva/corneas clear, EOM's intact, both eyes  Ears:    Normal external ear canals, both ears  Nose:   Nares normal, septum midline, mucosa normal, no drainage    or sinus tenderness  Throat:   Lips, mucosa, and tongue normal; teeth and gums normal  Neck:   Supple, symmetrical, trachea midline, no adenopathy;    thyroid:  no enlargement/tenderness/nodules  Back:     Symmetric, no curvature, ROM normal, no CVA tenderness  Lungs:     Clear to auscultation bilaterally, respirations unlabored  Chest Wall:    No tenderness or deformity   Heart:    Regular rate and rhythm, S1 and S2 normal, no murmur, rub   or gallop  Breast Exam:    No tenderness, masses, or nipple abnormality   Abdomen:     Soft, non-tender, bowel sounds active all four quadrants,    no masses, no organomegaly  Genitalia:    Normal female without lesion, discharge or tenderness     Extremities:   Extremities normal, atraumatic, no cyanosis or edema  Pulses:   2+ and symmetric all extremities  Skin:   Skin color, texture, turgor normal, no rashes or lesions    Assessment:    Healthy female exam.   Bleeding with LnIUD- need to screen for STI     Plan:    Follow up in: 1 year.    F/u STI screen serum and cx F/u PAP with hrHPV  Sam Wunschel L. Harraway-Smith, M.D., Cherlynn June

## 2018-07-20 NOTE — Progress Notes (Signed)
Patient has Mirena IUD for 5-6 months- patient reports irregular periods. Kathrene Alu RN

## 2018-07-21 LAB — RPR: RPR: NONREACTIVE

## 2018-07-21 LAB — HEPATITIS B SURFACE ANTIGEN: Hepatitis B Surface Ag: NEGATIVE

## 2018-07-21 LAB — HEPATITIS C ANTIBODY: Hep C Virus Ab: <0.1 s/co ratio (ref 0.0–0.9)

## 2018-07-21 LAB — HIV ANTIBODY (ROUTINE TESTING W REFLEX): HIV Screen 4th Generation wRfx: NONREACTIVE

## 2018-07-23 ENCOUNTER — Telehealth: Payer: Self-pay

## 2018-07-23 NOTE — Telephone Encounter (Signed)
-----   Message from Lavonia Drafts, MD sent at 07/22/2018  3:15 PM EST ----- please call pt. Her STI screen is neg.  Thx, clh-S

## 2018-07-23 NOTE — Telephone Encounter (Signed)
Phone rang multiple times and then hung up. Unable to leave message. Kathrene Alu RN

## 2018-07-24 LAB — CYTOLOGY - PAP
Bacterial vaginitis: POSITIVE — AB
CANDIDA VAGINITIS: NEGATIVE
CHLAMYDIA, DNA PROBE: NEGATIVE
DIAGNOSIS: NEGATIVE
HPV: NOT DETECTED
Neisseria Gonorrhea: NEGATIVE
Trichomonas: NEGATIVE

## 2018-07-30 ENCOUNTER — Other Ambulatory Visit: Payer: Self-pay | Admitting: Obstetrics & Gynecology

## 2018-07-30 ENCOUNTER — Telehealth: Payer: Self-pay

## 2018-07-30 DIAGNOSIS — N760 Acute vaginitis: Principal | ICD-10-CM

## 2018-07-30 DIAGNOSIS — B9689 Other specified bacterial agents as the cause of diseases classified elsewhere: Secondary | ICD-10-CM

## 2018-07-30 MED ORDER — METRONIDAZOLE 500 MG PO TABS: 500.0000 mg | ORAL_TABLET | Freq: Two times a day (BID) | ORAL | 0 refills | Status: AC

## 2018-07-30 NOTE — Telephone Encounter (Signed)
Attempted to reach patient and no answer. Phone call dropped after multiple rings. Kathrene Alu RN

## 2018-07-30 NOTE — Telephone Encounter (Signed)
-----   Message from Lavonia Drafts, MD sent at 07/30/2018  9:58 AM EST ----- Please call pt. Her cx was neg but, she has BV. Rx sent to pharmacy.  clh-S

## 2021-07-28 ENCOUNTER — Other Ambulatory Visit: Payer: Self-pay

## 2021-07-28 ENCOUNTER — Emergency Department (HOSPITAL_COMMUNITY): Payer: BC Managed Care – PPO

## 2021-07-28 ENCOUNTER — Encounter (HOSPITAL_COMMUNITY): Payer: Self-pay

## 2021-07-28 ENCOUNTER — Emergency Department (HOSPITAL_COMMUNITY)
Admission: EM | Admit: 2021-07-28 | Discharge: 2021-07-29 | Disposition: A | Discharge status: Home / Self Care | Payer: BC Managed Care – PPO | Attending: Emergency Medicine | Admitting: Emergency Medicine

## 2021-07-28 DIAGNOSIS — S99911A Unspecified injury of right ankle, initial encounter: Secondary | ICD-10-CM | POA: Diagnosis present

## 2021-07-28 DIAGNOSIS — Y92410 Unspecified street and highway as the place of occurrence of the external cause: Secondary | ICD-10-CM | POA: Diagnosis not present

## 2021-07-28 DIAGNOSIS — S93491A Sprain of other ligament of right ankle, initial encounter: Secondary | ICD-10-CM

## 2021-07-28 DIAGNOSIS — S93431A Sprain of tibiofibular ligament of right ankle, initial encounter: Secondary | ICD-10-CM | POA: Diagnosis not present

## 2021-07-28 DIAGNOSIS — T1490XA Injury, unspecified, initial encounter: Secondary | ICD-10-CM
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
## 2021-07-29 DIAGNOSIS — S93431A Sprain of tibiofibular ligament of right ankle, initial encounter: Secondary | ICD-10-CM | POA: Diagnosis not present

## 2021-07-29 MED ORDER — IBUPROFEN 600 MG PO TABS: 600.0000 mg | ORAL_TABLET | Freq: Four times a day (QID) | ORAL | 0 refills | Status: AC | PRN

## 2021-07-29 MED ORDER — CYCLOBENZAPRINE HCL 10 MG PO TABS: 10.0000 mg | ORAL_TABLET | Freq: Two times a day (BID) | ORAL | 0 refills | Status: DC | PRN

## 2021-07-29 MED ORDER — IBUPROFEN 800 MG PO TABS
800.0000 mg | ORAL_TABLET | Freq: Once | ORAL | Status: AC
  Administered 2021-07-29: 800 mg via ORAL
  Filled 2021-07-29: qty 1

## 2021-07-29 MED ORDER — IBUPROFEN 600 MG PO TABS: 600.0000 mg | ORAL_TABLET | Freq: Four times a day (QID) | ORAL | 0 refills | Status: DC | PRN

## 2021-07-29 MED ORDER — CYCLOBENZAPRINE HCL 10 MG PO TABS: 10.0000 mg | ORAL_TABLET | Freq: Two times a day (BID) | ORAL | 0 refills | Status: AC | PRN
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
## 2021-07-29 NOTE — ED Notes (Addendum)
Patient had visitor accompanying her back to [place] bed C. This visitor was wheeled back from Triage in wheelchair. Visitor came up to desk and asked this RN why when she was discharged did they did not send all of her prescriptions to [facility]. This RN explained that she was inpatient and had no idea about her discharge process.RN told visitor that she would need to contact Medical Records or discharging physician regarding medications given at discharge. Visitor said " I was on 5E". Patient asked about getting into main hospital from ED. This RN never told patient to walk upstairs and talk to [place]. This RN never thought that visitor would be capable to walk upstairs on her own to talk to secretary on [place] due to the fact that visitor was wheeled back to [place] bed as previously mentioned.[facility] was contacted by patient and visitor stating the RN told her to go up to [place]. This was not uttered out of this RN's mouth at any time.
